# Patient Record
Sex: Female | Born: 2000 | Race: White | Hispanic: No | Marital: Single | Smoking: Never smoker
Health system: Southern US, Community
[De-identification: ages and names within clinical notes are randomized; demographics above are authoritative.]

## PROBLEM LIST (undated history)

## (undated) DIAGNOSIS — F32A Depression, unspecified: Secondary | ICD-10-CM

---

## 2019-08-27 DIAGNOSIS — F1991 Other psychoactive substance use, unspecified, in remission: Secondary | ICD-10-CM | POA: Insufficient documentation

## 2019-08-27 DIAGNOSIS — Z87898 Personal history of other specified conditions: Secondary | ICD-10-CM | POA: Insufficient documentation

## 2020-07-07 DIAGNOSIS — F329 Major depressive disorder, single episode, unspecified: Secondary | ICD-10-CM | POA: Insufficient documentation

## 2020-09-19 ENCOUNTER — Telehealth (HOSPITAL_COMMUNITY): Payer: Medicaid Other | Admitting: Psychiatry

## 2020-10-10 ENCOUNTER — Telehealth (INDEPENDENT_AMBULATORY_CARE_PROVIDER_SITE_OTHER): Payer: Medicaid Other | Admitting: Psychiatry

## 2020-10-10 ENCOUNTER — Encounter (HOSPITAL_COMMUNITY): Payer: Self-pay | Admitting: Psychiatry

## 2020-10-10 DIAGNOSIS — F3341 Major depressive disorder, recurrent, in partial remission: Secondary | ICD-10-CM | POA: Diagnosis not present

## 2020-10-10 DIAGNOSIS — F411 Generalized anxiety disorder: Secondary | ICD-10-CM

## 2020-10-10 DIAGNOSIS — Z87898 Personal history of other specified conditions: Secondary | ICD-10-CM

## 2020-10-10 DIAGNOSIS — F1991 Other psychoactive substance use, unspecified, in remission: Secondary | ICD-10-CM

## 2020-10-10 MED ORDER — ESCITALOPRAM OXALATE 20 MG PO TABS
30.0000 mg | ORAL_TABLET | Freq: Every day | ORAL | 0 refills | Status: DC
Start: 2020-10-10 — End: 2020-11-15

## 2020-10-10 NOTE — Progress Notes (Signed)
Psychiatric Initial Adult Assessment   Patient Identification: Tammy Singh MRN:  [id number] Date of Evaluation:  10/10/2020 Referral Source: primary care Chief Complaint:  establish care, depression Visit Diagnosis:    ICD-10-CM   1. MDD (major depressive disorder), recurrent, in partial remission (HCC)  F33.41   2. GAD (generalized anxiety disorder)  F41.1   3. History of drug use  Z87.898     I connected with [name] on 10/10/20 at 11:00 AM EST by a video enabled telemedicine application and verified that I am speaking with the correct person using two identifiers.  Location: Patient: home Provider: home office   I discussed the limitations of evaluation and management by telemedicine and the availability of in person appointments. The patient expressed understanding and agreed to proceed.  History of Present Illness: Patient is a 19 years old currently single Caucasian female referred by primary care physician for management of depression and establish care.  Patient is currently 6 months postpartum  Patient both with her baby with good bonding.  Has history of depression starting 1-1/2-year ago while she was pregnant and before that when she was having relationship concerns.  She endorses episodes of depression that can last for days including decreased interest hopelessness withdrawn crying spells. She has been having postpartum blues recently started on Lexapro now current dose of 20 mg that has helped she feels her depression is improving she is somewhat more motivated still endorses feeling down and subdued and excessive worries related to future related to relationship  She does not endorse psychotic symptoms paranoia or hallucinations  She does endorse using alcohol states she binge drinks mostly over the weekend 67 packs of beer She has used Xanax in the past around 3 years ago she stopped she was using 3 to 4 tablets a day says that she did not wanted to get done  with it so she stopped cold [place] and felt better  Aggravating factors; relationship issues in the past.  Sometimes arguments with mom Modifying factors; her baby, her mom her family  Duration for the last 2 years  There is no clear manic history she does get impulsive at times or by staff or does tattoos that can last for 2 hours or 1 day no associated psychotic symptoms  No prior psych admission with suicide attempt     Past Psychiatric History: depression  Previous Psychotropic Medications: No   Substance Abuse History in the last 12 months:  Yes.    Consequences of Substance Abuse: alcohol use, discussed its effect on mood, depression  Past Medical History: History reviewed. No pertinent past medical history. History reviewed. No pertinent surgical history.  Family Psychiatric History: mom: depression /bipolar, Grand MA depression Alcohol use In family  Family History: History reviewed. No pertinent family history.  Social History:   Social History   Socioeconomic History  . Marital status: Single    Spouse name: Not on file  . Number of children: Not on file  . Years of education: Not on file  . Highest education level: Not on file  Occupational History  . Not on file  Tobacco Use  . Smoking status: Not on file  Substance and Sexual Activity  . Alcohol use: Not on file  . Drug use: Not on file  . Sexual activity: Not on file  Other Topics Concern  . Not on file  Social History Narrative  . Not on file   Social Determinants of Health   Financial Resource Strain:   .  Difficulty of Paying Living Expenses: Not on file  Food Insecurity:   . Worried About Programme researcher, broadcasting/film/video in the Last Year: Not on file  . Ran Out of Food in the Last Year: Not on file  Transportation Needs:   . Lack of Transportation (Medical): Not on file  . Lack of Transportation (Non-Medical): Not on file  Physical Activity:   . Days of Exercise per Week: Not on file  . Minutes of  Exercise per Session: Not on file  Stress:   . Feeling of Stress : Not on file  Social Connections:   . Frequency of Communication with Friends and Family: Not on file  . Frequency of Social Gatherings with Friends and Family: Not on file  . Attends Religious Services: Not on file  . Active Member of Clubs or Organizations: Not on file  . Attends Banker Meetings: Not on file  . Marital Status: Not on file    Additional Social History: grew up with mom and Grand parents. Dad died of car accident when patient was 19 years of age No abuse   Allergies:  No Known Allergies  Metabolic Disorder Labs: No results found for: HGBA1C, MPG No results found for: PROLACTIN No results found for: CHOL, TRIG, HDL, CHOLHDL, VLDL, LDLCALC No results found for: TSH  Therapeutic Level Labs: No results found for: LITHIUM No results found for: CBMZ No results found for: VALPROATE  Current Medications: Current Outpatient Medications  Medication Sig Dispense Refill  . escitalopram (LEXAPRO) 20 MG tablet Take 1.5 tablets (30 mg total) by mouth daily. 45 tablet 0   No current facility-administered medications for this visit.     Psychiatric Specialty Exam: Review of Systems  Cardiovascular: Negative for chest pain.  Psychiatric/Behavioral: Negative for self-injury.    There were no vitals taken for this visit.There is no height or weight on file to calculate BMI.  General Appearance: Casual  Eye Contact:  Fair  Speech:  Normal Rate  Volume:  Decreased  Mood:  somewhat subdued  Affect:  Congruent  Thought Process:  Goal Directed  Orientation:  Full (Time, Place, and Person)  Thought Content:  Rumination  Suicidal Thoughts:  No  Homicidal Thoughts:  No  Memory:  Immediate;   Fair Recent;   Fair  Judgement:  Fair  Insight:  Shallow  Psychomotor Activity:  Decreased  Concentration:  Concentration: Fair and Attention Span: Fair  Recall:  [employer] of Knowledge:Fair   Language: Fair  Akathisia:  No  Handed:   AIMS (if indicated):  not done  Assets:  Communication Skills Desire for Improvement  ADL's:  Intact  Cognition: WNL  Sleep:  Fair   Screenings:   Assessment and Plan: as follows Major depressive disorder recurrent moderate in partial remission; she does feel Lexapro is helping still feels her mind is racing at times when she gets agitated or subdued mood we will increase it to 30 mg discussed side effects Sometimes gets edgy depending upon relationship and also with mom.  Also discussed possibility of adding a mood stabilizer for now she feels it is partly related to depression we will increase it to 30 mg Lexapro as above  Generalized anxiety disorder; still somewhat stressed considering her situation increase Lexapro to 30 mg  She is currently doing therapy with Derwood Kaplan recommend to continue with that  She does have good bonding with the baby and is not breast-feeding  Alcohol use; she binge drinks over the  weekends we discussed abstinence and also alcohol effect on mood depression and medications  She says that she will quit she understands the risk considering family history of alcohol use and past history of Xanax use she is more vulnerable to dependence  Reviewed medications I discussed the assessment and treatment plan with the patient. The patient was provided an opportunity to ask questions and all were answered. The patient agreed with the plan and demonstrated an understanding of the instructions.   The patient was advised to call back or seek an in-person evaluation if the symptoms worsen or if the condition fails to improve as anticipated. Fu 4 weeks or earlier if needed  I provided 35 - 40  minutes of non-face-to-face time during this encounter.   [name], MD 12/6/202111:41 AM

## 2020-11-15 ENCOUNTER — Telehealth (INDEPENDENT_AMBULATORY_CARE_PROVIDER_SITE_OTHER): Payer: Medicaid Other | Admitting: Psychiatry

## 2020-11-15 ENCOUNTER — Encounter (HOSPITAL_COMMUNITY): Payer: Self-pay | Admitting: Psychiatry

## 2020-11-15 DIAGNOSIS — F1991 Other psychoactive substance use, unspecified, in remission: Secondary | ICD-10-CM

## 2020-11-15 DIAGNOSIS — F3341 Major depressive disorder, recurrent, in partial remission: Secondary | ICD-10-CM

## 2020-11-15 DIAGNOSIS — Z87898 Personal history of other specified conditions: Secondary | ICD-10-CM | POA: Diagnosis not present

## 2020-11-15 DIAGNOSIS — F411 Generalized anxiety disorder: Secondary | ICD-10-CM

## 2020-11-15 MED ORDER — ESCITALOPRAM OXALATE 20 MG PO TABS
30.0000 mg | ORAL_TABLET | Freq: Every day | ORAL | 1 refills | Status: DC
Start: 2020-11-15 — End: 2020-12-20

## 2020-11-15 NOTE — Progress Notes (Signed)
[facility] Follow up visit   Patient Identification: Tammy Singh MRN:  [id number] Date of Evaluation:  11/15/2020 Referral Source: primary care Chief Complaint:   follow up  depression Visit Diagnosis:    ICD-10-CM   1. MDD (major depressive disorder), recurrent, in partial remission (HCC)  F33.41   2. GAD (generalized anxiety disorder)  F41.1   3. History of drug use  Z87.898      I connected with [name] on 11/15/20 at  3:00 PM EST by telephone and verified that I am speaking with the correct person using two identifiers.  Location: Patient: home Provider: home office   I discussed the limitations of evaluation and management by telemedicine and the availability of in person appointments. The patient expressed understanding and agreed to proceed.  History of Present Illness: Patient is a 20 years old currently single Caucasian female initially  referred by primary care physician for management of depression and establish care.  Patient is currently 6 months postpartum  Medicine helping some, subdued mood at times and gets anxious, need a car for transport, not working so these things add to anxiety lexapro was increased last visit Mom is supportive Bonding with baby is described good.  She does not endorse psychotic symptoms paranoia or hallucinations    Aggravating factors; relationship issues in the past.  Sometimes arguments with mom Modifying factors; family and her baby.  Duration for the last 2 years  No prior psych admission with suicide attempt     Past Psychiatric History: depression  Previous Psychotropic Medications: No   Substance Abuse History in the last 12 months:  Yes.    Consequences of Substance Abuse: alcohol use, discussed its effect on mood, depression  Past Medical History: History reviewed. No pertinent past medical history. History reviewed. No pertinent surgical history.  Family Psychiatric History: mom: depression /bipolar, Grand  MA depression Alcohol use In family  Family History: History reviewed. No pertinent family history.  Social History:   Social History   Socioeconomic History  . Marital status: Single    Spouse name: Not on file  . Number of children: Not on file  . Years of education: Not on file  . Highest education level: Not on file  Occupational History  . Not on file  Tobacco Use  . Smoking status: Not on file  . Smokeless tobacco: Not on file  Substance and Sexual Activity  . Alcohol use: Not on file  . Drug use: Not on file  . Sexual activity: Not on file  Other Topics Concern  . Not on file  Social History Narrative  . Not on file   Social Determinants of Health   Financial Resource Strain: Not on file  Food Insecurity: Not on file  Transportation Needs: Not on file  Physical Activity: Not on file  Stress: Not on file  Social Connections: Not on file      Allergies:  No Known Allergies  Metabolic Disorder Labs: No results found for: HGBA1C, MPG No results found for: PROLACTIN No results found for: CHOL, TRIG, HDL, CHOLHDL, VLDL, LDLCALC No results found for: TSH  Therapeutic Level Labs: No results found for: LITHIUM No results found for: CBMZ No results found for: VALPROATE  Current Medications: Current Outpatient Medications  Medication Sig Dispense Refill  . escitalopram (LEXAPRO) 20 MG tablet Take 1.5 tablets (30 mg total) by mouth daily. 45 tablet 1   No current facility-administered medications for this visit.     Psychiatric Specialty Exam:  Review of Systems  Cardiovascular: Negative for chest pain.  Psychiatric/Behavioral: Negative for agitation and self-injury.    There were no vitals taken for this visit.There is no height or weight on file to calculate BMI.  General Appearance:  Eye Contact:   Speech:  Normal Rate  Volume:  Decreased  Mood:  somewhat subdued  Affect:  Congruent  Thought Process:  Goal Directed  Orientation:  Full (Time,  Place, and Person)  Thought Content:  Rumination  Suicidal Thoughts:  No  Homicidal Thoughts:  No  Memory:  Immediate;   Fair Recent;   Fair  Judgement:  Fair  Insight:  Shallow  Psychomotor Activity:  Decreased  Concentration:  Concentration: Fair and Attention Span: Fair  Recall:  [employer] of Knowledge:Fair  Language: Fair  Akathisia:  No  Handed:   AIMS (if indicated):  not done  Assets:  Communication Skills Desire for Improvement  ADL's:  Intact  Cognition: WNL  Sleep:  Fair   Screenings:   Assessment and Plan: as follows Major depressive disorder recurrent moderate in partial remission; Somewhat subdued due to circumstances but tolerating med and helps some  Does not want to increase more for now   Generalized anxiety disorder; worries are reasonable, continue lexapro,  She is currently doing therapy with Derwood Kaplan recommend to continue with that   Alcohol use; she binge drinks over the weekends we discussed abstinence and also alcohol effect on mood depression and medications   Reviewed medications I discussed the assessment and treatment plan with the patient. The patient was provided an opportunity to ask questions and all were answered. The patient agreed with the plan and demonstrated an understanding of the instructions.   The patient was advised to call back or seek an in-person evaluation if the symptoms worsen or if the condition fails to improve as anticipated. Fu 4 weeks or earlier if needed  I provided 15  minutes of non-face-to-face time during this encounter.   [name], MD 1/11/20223:14 PM

## 2020-12-20 ENCOUNTER — Encounter (HOSPITAL_COMMUNITY): Payer: Self-pay | Admitting: Psychiatry

## 2020-12-20 ENCOUNTER — Telehealth (INDEPENDENT_AMBULATORY_CARE_PROVIDER_SITE_OTHER): Payer: Medicaid Other | Admitting: Psychiatry

## 2020-12-20 DIAGNOSIS — F1991 Other psychoactive substance use, unspecified, in remission: Secondary | ICD-10-CM

## 2020-12-20 DIAGNOSIS — F3341 Major depressive disorder, recurrent, in partial remission: Secondary | ICD-10-CM

## 2020-12-20 DIAGNOSIS — Z87898 Personal history of other specified conditions: Secondary | ICD-10-CM | POA: Diagnosis not present

## 2020-12-20 DIAGNOSIS — F411 Generalized anxiety disorder: Secondary | ICD-10-CM

## 2020-12-20 MED ORDER — ESCITALOPRAM OXALATE 20 MG PO TABS
30.0000 mg | ORAL_TABLET | Freq: Every day | ORAL | 1 refills | Status: DC
Start: 2020-12-20 — End: 2022-02-19

## 2020-12-20 NOTE — Progress Notes (Signed)
[facility] Follow up visit   Patient Identification: Tammy Singh MRN:  [id number] Date of Evaluation:  12/20/2020 Referral Source: primary care Chief Complaint:   follow up  depression Visit Diagnosis:    ICD-10-CM   1. MDD (major depressive disorder), recurrent, in partial remission (HCC)  F33.41   2. GAD (generalized anxiety disorder)  F41.1   3. History of drug use  Z87.898      Virtual Visit via Video Note  I connected with Tammy Singh on 12/20/20 at  4:15 PM EST by a video enabled telemedicine application and verified that I am speaking with the correct person using two identifiers.  Location: Patient: home Provider: office   I discussed the limitations of evaluation and management by telemedicine and the availability of in person appointments. The patient expressed understanding and agreed to proceed.     I discussed the assessment and treatment plan with the patient. The patient was provided an opportunity to ask questions and all were answered. The patient agreed with the plan and demonstrated an understanding of the instructions.   The patient was advised to call back or seek an in-person evaluation if the symptoms worsen or if the condition fails to improve as anticipated.  I provided 10 minutes of non-face-to-face time during this encounter.   [name], MD  History of Present Illness: Patient is a 20 years old currently single Caucasian female initially  referred by primary care physician for management of depression and establish care.  Patient is postpartum 8 months now  Doing better, feels depression improved Mom is supportive and has good bonding    She does not endorse psychotic symptoms paranoia or hallucinations  Aggravating factors; relationship issues in the past. Arguments with mom at times Modifying factors; family, baby  Duration for the last 2 years  No prior psych admission with suicide attempt     Past Psychiatric History:  depression  Previous Psychotropic Medications: No   Substance Abuse History in the last 12 months:  Yes.    Consequences of Substance Abuse: alcohol use, discussed its effect on mood, depression  Past Medical History: History reviewed. No pertinent past medical history. History reviewed. No pertinent surgical history.  Family Psychiatric History: mom: depression /bipolar, Grand MA depression Alcohol use In family  Family History: History reviewed. No pertinent family history.  Social History:   Social History   Socioeconomic History  . Marital status: Single    Spouse name: Not on file  . Number of children: Not on file  . Years of education: Not on file  . Highest education level: Not on file  Occupational History  . Not on file  Tobacco Use  . Smoking status: Not on file  . Smokeless tobacco: Not on file  Substance and Sexual Activity  . Alcohol use: Not on file  . Drug use: Not on file  . Sexual activity: Not on file  Other Topics Concern  . Not on file  Social History Narrative  . Not on file   Social Determinants of Health   Financial Resource Strain: Not on file  Food Insecurity: Not on file  Transportation Needs: Not on file  Physical Activity: Not on file  Stress: Not on file  Social Connections: Not on file      Allergies:  No Known Allergies  Metabolic Disorder Labs: No results found for: HGBA1C, MPG No results found for: PROLACTIN No results found for: CHOL, TRIG, HDL, CHOLHDL, VLDL, LDLCALC No results found for: TSH  Therapeutic Level Labs: No results found for: LITHIUM No results found for: CBMZ No results found for: VALPROATE  Current Medications: Current Outpatient Medications  Medication Sig Dispense Refill  . escitalopram (LEXAPRO) 20 MG tablet Take 1.5 tablets (30 mg total) by mouth daily. 45 tablet 1   No current facility-administered medications for this visit.     Psychiatric Specialty Exam: Review of Systems   Cardiovascular: Negative for chest pain.  Psychiatric/Behavioral: Negative for agitation and self-injury.    There were no vitals taken for this visit.There is no height or weight on file to calculate BMI.  General Appearance:casual  Eye Contact: fair  Speech:  Normal Rate  Volume:  Decreased  Mood:  better  Affect:  Congruent  Thought Process:  Goal Directed  Orientation:  Full (Time, Place, and Person)  Thought Content:  Rumination  Suicidal Thoughts:  No  Homicidal Thoughts:  No  Memory:  Immediate;   Fair Recent;   Fair  Judgement:  Fair  Insight:  Shallow  Psychomotor Activity:  Decreased  Concentration:  Concentration: Fair and Attention Span: Fair  Recall:  [employer] of Knowledge:Fair  Language: Fair  Akathisia:  No  Handed:   AIMS (if indicated):  not done  Assets:  Communication Skills Desire for Improvement  ADL's:  Intact  Cognition: WNL  Sleep:  Fair   Screenings: Flowsheet Row Video [facility] [place]  C-SSRS RISK CATEGORY No Risk      Assessment and Plan: as follows  Prior documentation reviewed  Major depressive disorder recurrent moderate in partial remission; Better, continue lexapro Does not want to increase more for now   Generalized anxiety disorder; improving, continue lexapro    Alcohol use; understands not to drink, was binge drinking in the past. Knows effect   Reviewed medications Fu 5m.   [name], MD 2/15/20224:27 PM

## 2021-02-16 ENCOUNTER — Telehealth (HOSPITAL_COMMUNITY): Payer: Medicaid Other | Admitting: Psychiatry

## 2022-02-06 ENCOUNTER — Encounter (HOSPITAL_COMMUNITY): Payer: Self-pay | Admitting: Emergency Medicine

## 2022-02-06 ENCOUNTER — Other Ambulatory Visit: Payer: Self-pay

## 2022-02-06 ENCOUNTER — Inpatient Hospital Stay (HOSPITAL_COMMUNITY)
Admission: EM | Admit: 2022-02-06 | Discharge: 2022-02-09 | DRG: 418 | Disposition: A | Discharge status: Home / Self Care | Payer: Medicaid Other | Attending: General Surgery | Admitting: General Surgery

## 2022-02-06 DIAGNOSIS — K810 Acute cholecystitis: Secondary | ICD-10-CM

## 2022-02-06 DIAGNOSIS — K8020 Calculus of gallbladder without cholecystitis without obstruction: Secondary | ICD-10-CM

## 2022-02-06 DIAGNOSIS — K859 Acute pancreatitis without necrosis or infection, unspecified: Secondary | ICD-10-CM

## 2022-02-06 DIAGNOSIS — K8000 Calculus of gallbladder with acute cholecystitis without obstruction: Secondary | ICD-10-CM | POA: Diagnosis present

## 2022-02-06 DIAGNOSIS — Z3201 Encounter for pregnancy test, result positive: Secondary | ICD-10-CM | POA: Diagnosis present

## 2022-02-06 DIAGNOSIS — Z3490 Encounter for supervision of normal pregnancy, unspecified, unspecified trimester: Secondary | ICD-10-CM

## 2022-02-06 DIAGNOSIS — K851 Biliary acute pancreatitis without necrosis or infection: Principal | ICD-10-CM | POA: Diagnosis present

## 2022-02-06 DIAGNOSIS — F17290 Nicotine dependence, other tobacco product, uncomplicated: Secondary | ICD-10-CM | POA: Diagnosis present

## 2022-02-06 DIAGNOSIS — D72829 Elevated white blood cell count, unspecified: Secondary | ICD-10-CM | POA: Diagnosis present

## 2022-02-06 DIAGNOSIS — Z3491 Encounter for supervision of normal pregnancy, unspecified, first trimester: Secondary | ICD-10-CM

## 2022-02-06 HISTORY — DX: Depression, unspecified: F32.A

## 2022-02-06 NOTE — ED Triage Notes (Signed)
Ems gave pt 4mg  of zofran. ?

## 2022-02-06 NOTE — ED Triage Notes (Signed)
Pt c/o abd/back/chest pain with n/v. ?

## 2022-02-07 ENCOUNTER — Inpatient Hospital Stay (HOSPITAL_COMMUNITY): Payer: Medicaid Other

## 2022-02-07 ENCOUNTER — Encounter (HOSPITAL_COMMUNITY): Payer: Self-pay | Admitting: Family Medicine

## 2022-02-07 DIAGNOSIS — D72829 Elevated white blood cell count, unspecified: Secondary | ICD-10-CM | POA: Diagnosis not present

## 2022-02-07 DIAGNOSIS — K8020 Calculus of gallbladder without cholecystitis without obstruction: Secondary | ICD-10-CM | POA: Diagnosis not present

## 2022-02-07 DIAGNOSIS — K859 Acute pancreatitis without necrosis or infection, unspecified: Secondary | ICD-10-CM | POA: Diagnosis present

## 2022-02-07 DIAGNOSIS — K8000 Calculus of gallbladder with acute cholecystitis without obstruction: Secondary | ICD-10-CM | POA: Diagnosis present

## 2022-02-07 DIAGNOSIS — F17290 Nicotine dependence, other tobacco product, uncomplicated: Secondary | ICD-10-CM | POA: Diagnosis present

## 2022-02-07 DIAGNOSIS — Z3201 Encounter for pregnancy test, result positive: Secondary | ICD-10-CM | POA: Diagnosis present

## 2022-02-07 DIAGNOSIS — Z3491 Encounter for supervision of normal pregnancy, unspecified, first trimester: Secondary | ICD-10-CM | POA: Diagnosis not present

## 2022-02-07 DIAGNOSIS — K851 Biliary acute pancreatitis without necrosis or infection: Secondary | ICD-10-CM | POA: Diagnosis present

## 2022-02-07 DIAGNOSIS — K810 Acute cholecystitis: Secondary | ICD-10-CM | POA: Diagnosis not present

## 2022-02-07 LAB — URINALYSIS, ROUTINE W REFLEX MICROSCOPIC
Bilirubin Urine: NEGATIVE
Glucose, UA: NEGATIVE mg/dL
Hgb urine dipstick: NEGATIVE
Ketones, ur: 5 mg/dL — AB
Nitrite: NEGATIVE
Protein, ur: 100 mg/dL — AB
Specific Gravity, Urine: 1.024 (ref 1.005–1.030)
pH: 7 (ref 5.0–8.0)

## 2022-02-07 LAB — CBC WITH DIFFERENTIAL/PLATELET
Abs Immature Granulocytes: 0.03 10*3/uL (ref 0.00–0.07)
Abs Immature Granulocytes: 0.04 10*3/uL (ref 0.00–0.07)
Basophils Absolute: 0 10*3/uL (ref 0.0–0.1)
Basophils Absolute: 0 10*3/uL (ref 0.0–0.1)
Basophils Relative: 0 %
Basophils Relative: 0 %
Eosinophils Absolute: 0 10*3/uL (ref 0.0–0.5)
Eosinophils Absolute: 0 10*3/uL (ref 0.0–0.5)
Eosinophils Relative: 0 %
Eosinophils Relative: 0 %
HCT: 37.2 % (ref 36.0–46.0)
HCT: 40.1 % (ref 36.0–46.0)
Hemoglobin: 12 g/dL (ref 12.0–15.0)
Hemoglobin: 13.1 g/dL (ref 12.0–15.0)
Immature Granulocytes: 0 %
Immature Granulocytes: 0 %
Lymphocytes Relative: 10 %
Lymphocytes Relative: 22 %
Lymphs Abs: 1.6 10*3/uL (ref 0.7–4.0)
Lymphs Abs: 1.9 10*3/uL (ref 0.7–4.0)
MCH: 26.8 pg (ref 26.0–34.0)
MCH: 27.1 pg (ref 26.0–34.0)
MCHC: 32.3 g/dL (ref 30.0–36.0)
MCHC: 32.7 g/dL (ref 30.0–36.0)
MCV: 82.9 fL (ref 80.0–100.0)
MCV: 83 fL (ref 80.0–100.0)
Monocytes Absolute: 0.5 10*3/uL (ref 0.1–1.0)
Monocytes Absolute: 0.8 10*3/uL (ref 0.1–1.0)
Monocytes Relative: 5 %
Monocytes Relative: 6 %
Neutro Abs: 13.4 10*3/uL — ABNORMAL HIGH (ref 1.7–7.7)
Neutro Abs: 6.3 10*3/uL (ref 1.7–7.7)
Neutrophils Relative %: 72 %
Neutrophils Relative %: 85 %
Platelets: 399 10*3/uL (ref 150–400)
Platelets: 458 10*3/uL — ABNORMAL HIGH (ref 150–400)
RBC: 4.48 MIL/uL (ref 3.87–5.11)
RBC: 4.84 MIL/uL (ref 3.87–5.11)
RDW: 13.9 % (ref 11.5–15.5)
RDW: 14.1 % (ref 11.5–15.5)
WBC: 15.8 10*3/uL — ABNORMAL HIGH (ref 4.0–10.5)
WBC: 8.8 10*3/uL (ref 4.0–10.5)
nRBC: 0 % (ref 0.0–0.2)
nRBC: 0 % (ref 0.0–0.2)

## 2022-02-07 LAB — COMPREHENSIVE METABOLIC PANEL
ALT: 140 U/L — ABNORMAL HIGH (ref 0–44)
ALT: 157 U/L — ABNORMAL HIGH (ref 0–44)
ALT: 22 U/L (ref 0–44)
AST: 150 U/L — ABNORMAL HIGH (ref 15–41)
AST: 36 U/L (ref 15–41)
AST: 469 U/L — ABNORMAL HIGH (ref 15–41)
Albumin: 3.4 g/dL — ABNORMAL LOW (ref 3.5–5.0)
Albumin: 3.6 g/dL (ref 3.5–5.0)
Albumin: 4.1 g/dL (ref 3.5–5.0)
Alkaline Phosphatase: 101 U/L (ref 38–126)
Alkaline Phosphatase: 103 U/L (ref 38–126)
Alkaline Phosphatase: 96 U/L (ref 38–126)
Anion gap: 7 (ref 5–15)
Anion gap: 9 (ref 5–15)
Anion gap: 9 (ref 5–15)
BUN: 13 mg/dL (ref 6–20)
BUN: 14 mg/dL (ref 6–20)
BUN: 9 mg/dL (ref 6–20)
CO2: 21 mmol/L — ABNORMAL LOW (ref 22–32)
CO2: 21 mmol/L — ABNORMAL LOW (ref 22–32)
CO2: 25 mmol/L (ref 22–32)
Calcium: 8.8 mg/dL — ABNORMAL LOW (ref 8.9–10.3)
Calcium: 9 mg/dL (ref 8.9–10.3)
Calcium: 9.5 mg/dL (ref 8.9–10.3)
Chloride: 104 mmol/L (ref 98–111)
Chloride: 108 mmol/L (ref 98–111)
Chloride: 111 mmol/L (ref 98–111)
Creatinine, Ser: 0.5 mg/dL (ref 0.44–1.00)
Creatinine, Ser: 0.51 mg/dL (ref 0.44–1.00)
Creatinine, Ser: 0.57 mg/dL (ref 0.44–1.00)
GFR, Estimated: >60 mL/min (ref >60)
GFR, Estimated: >60 mL/min (ref >60)
GFR, Estimated: >60 mL/min (ref >60)
Glucose, Bld: 112 mg/dL — ABNORMAL HIGH (ref 70–99)
Glucose, Bld: 84 mg/dL (ref 70–99)
Glucose, Bld: 94 mg/dL (ref 70–99)
Potassium: 3.4 mmol/L — ABNORMAL LOW (ref 3.5–5.1)
Potassium: 3.4 mmol/L — ABNORMAL LOW (ref 3.5–5.1)
Potassium: 3.9 mmol/L (ref 3.5–5.1)
Sodium: 138 mmol/L (ref 135–145)
Sodium: 138 mmol/L (ref 135–145)
Sodium: 139 mmol/L (ref 135–145)
Total Bilirubin: 0.8 mg/dL (ref 0.3–1.2)
Total Bilirubin: 1 mg/dL (ref 0.3–1.2)
Total Bilirubin: 1.2 mg/dL (ref 0.3–1.2)
Total Protein: 6.4 g/dL — ABNORMAL LOW (ref 6.5–8.1)
Total Protein: 6.5 g/dL (ref 6.5–8.1)
Total Protein: 7.5 g/dL (ref 6.5–8.1)

## 2022-02-07 LAB — LIPASE, BLOOD: Lipase: 607 U/L — ABNORMAL HIGH (ref 11–51)

## 2022-02-07 LAB — LIPID PANEL
Cholesterol: 108 mg/dL (ref 0–200)
HDL: 54 mg/dL (ref >40)
LDL Cholesterol: 50 mg/dL (ref 0–99)
Total CHOL/HDL Ratio: 2 RATIO
Triglycerides: 20 mg/dL (ref <150)
VLDL: 4 mg/dL (ref 0–40)

## 2022-02-07 LAB — MAGNESIUM: Magnesium: 2 mg/dL (ref 1.7–2.4)

## 2022-02-07 LAB — PREGNANCY, URINE: Preg Test, Ur: NEGATIVE

## 2022-02-07 LAB — HIV ANTIBODY (ROUTINE TESTING W REFLEX): HIV Screen 4th Generation wRfx: NONREACTIVE

## 2022-02-07 LAB — HCG, QUANTITATIVE, PREGNANCY: hCG, Beta Chain, Quant, S: <1 m[IU]/mL (ref <5)

## 2022-02-07 IMAGING — MR MR ABDOMEN WO/W CM MRCP
21 of 24 series · 44 of 48 positions shown · IV contrast (Gadavist)
Comparison: Abdominal sonogram which was performed on [DATE].

CLINICAL DATA: Suspected biliary obstruction, history of
pancreatitis.

EXAM:
MRI ABDOMEN WITHOUT AND WITH CONTRAST (INCLUDING MRCP)
TECHNIQUE: Multiplanar multisequence MR imaging of the abdomen was performed
both before and after the administration of intravenous contrast.
Heavily T2-weighted images of the biliary and pancreatic ducts were
obtained, and three-dimensional MRCP images were rendered by post
processing.
CONTRAST:  6mL GADAVIST GADOBUTROL 1 MMOL/ML IV SOLN

[Series 3: cor haste · coronal · 6.0mm · 1.25mm/px · 1 of 26 slices shown]
[im 1/26]
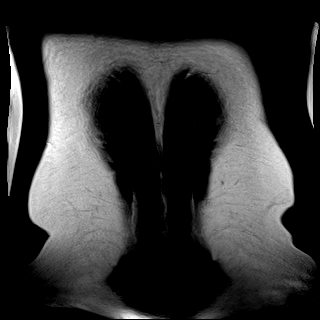

[Series 4: T2 fat-sat · axial · 6.0mm · 1.19mm/px · 1 of 30 slices shown]
[im 1/30]
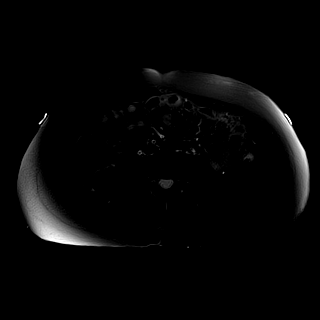

[Series 5: DWI · axial · 6.0mm · 1.42mm/px · 1 of 30 slices shown (1 of 4)]
[im 1/30]
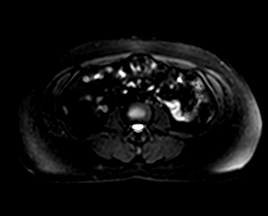

[Series 5: DWI · axial · 6.0mm · 1.42mm/px · 1 of 30 slices shown (2 of 4)]
[im 1/30]
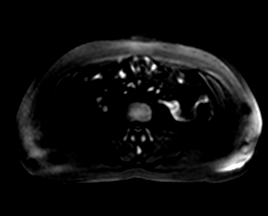

[Series 5: DWI · axial · 6.0mm · 1.42mm/px · 1 of 30 slices shown (3 of 4)]
[im 1/30]
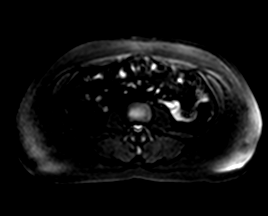

[Series 6: DWI · axial · 6.0mm · 1.42mm/px · 1 of 30 slices shown (4 of 4)]
[im 1/30]
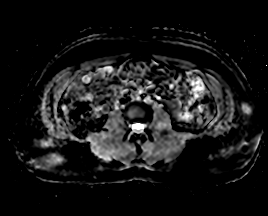

[Series 7: ax in and · axial · 3.0mm · 1.25mm/px · z∈[-116,+97]mm · 2 of 72 slices shown (1 of 2)]
[im 1/72]
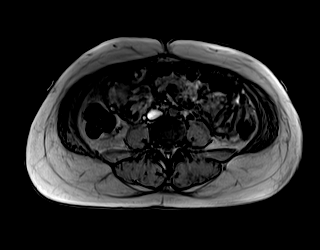
[im 72/72]
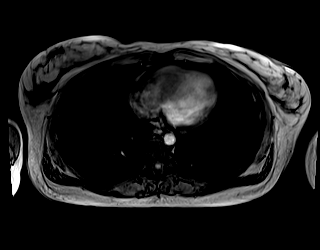

[Series 8: ax in and · axial · 3.0mm · 1.25mm/px · z∈[-116,+97]mm · 3 of 72 slices shown (2 of 2)]
[im 1/72]
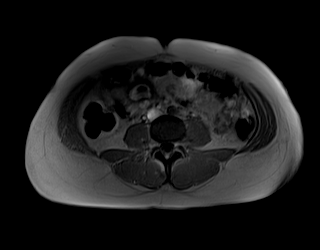
[im 36/72]
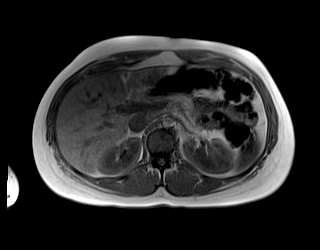
[im 72/72]
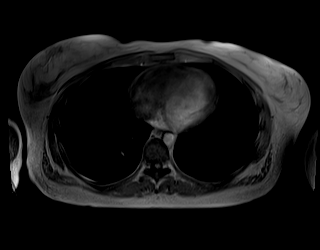

[Series 9: ax haste bh · axial · 6.0mm · 1.19mm/px · 1 of 30 slices shown]
[im 1/30]
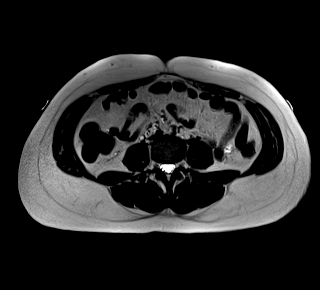

[Series 13: MRCP · coronal · 50.0mm · 0.78mm/px · 1 of 5 slices shown (1 of 2)]
[im 1/5]
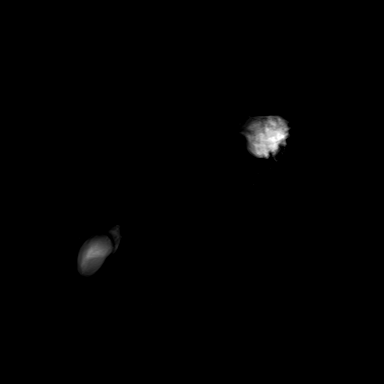

[Series 14: MRCP · coronal · 4.0mm · 1.12mm/px · 1 of 15 slices shown (2 of 2)]
[im 1/15]
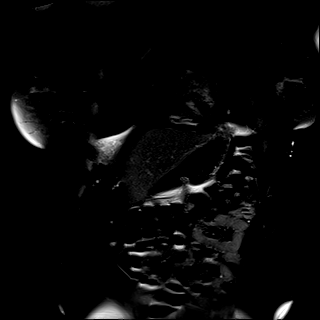

[Series 15: T1 dynamic · axial · 3.0mm · 1.19mm/px · z∈[-116,+97]mm · 3 of 72 slices shown (1 of 7)]
[im 1/72]
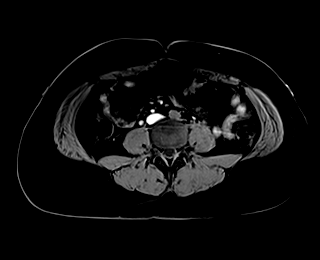
[im 36/72]
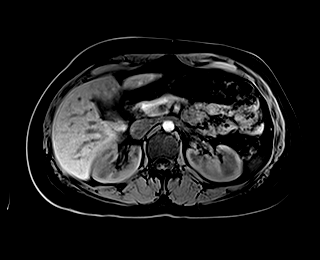
[im 72/72]
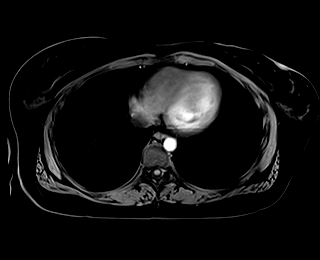

[Series 17: T1 dynamic · axial · 3.0mm · 1.19mm/px · z∈[-116,+97]mm · 3 of 72 slices shown (2 of 7)]
[im 1/72]
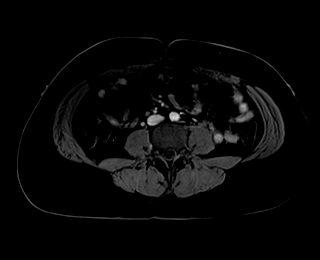
[im 36/72]
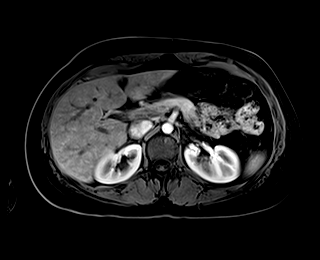
[im 72/72]
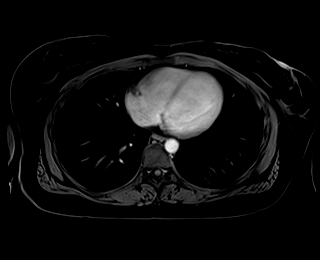

[Series 18: T1 dynamic · axial · 3.0mm · 1.19mm/px · z∈[-116,+97]mm · 3 of 72 slices shown (3 of 7)]
[im 1/72]
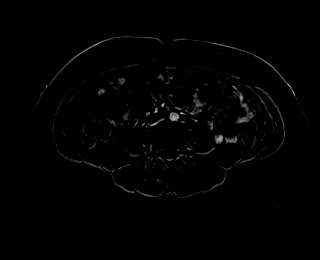
[im 36/72]
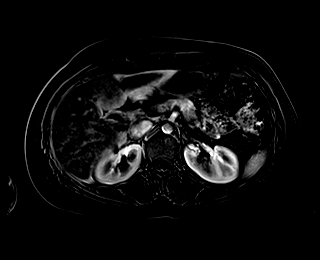
[im 72/72]
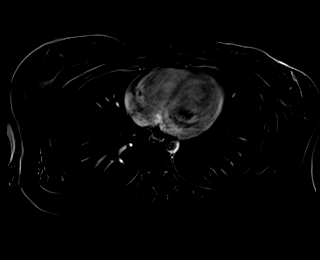

[Series 19: T1 dynamic · axial · 3.0mm · 1.19mm/px · z∈[-116,+97]mm · 3 of 72 slices shown (4 of 7)]
[im 1/72]
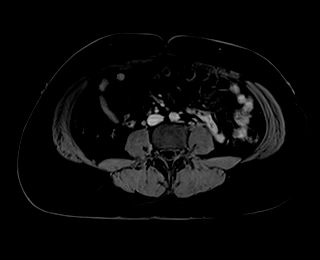
[im 36/72]
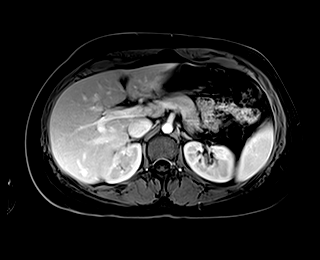
[im 72/72]
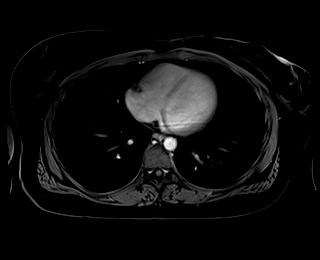

[Series 20: T1 dynamic · axial · 3.0mm · 1.19mm/px · z∈[-116,+97]mm · 3 of 72 slices shown (5 of 7)]
[im 1/72]
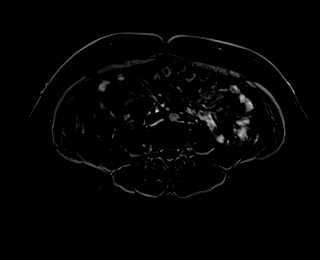
[im 36/72]
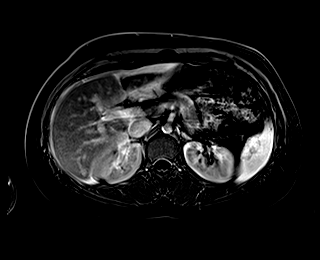
[im 72/72]
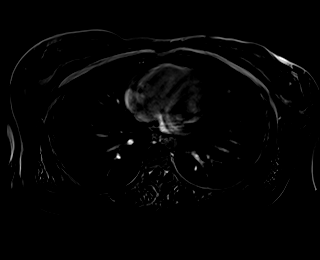

[Series 21: T1 dynamic · axial · 3.0mm · 1.19mm/px · z∈[-116,+97]mm · 3 of 72 slices shown (6 of 7)]
[im 1/72]
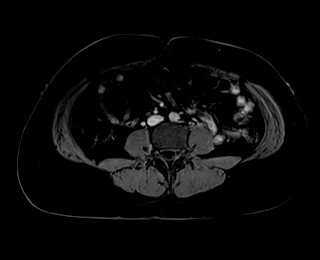
[im 36/72]
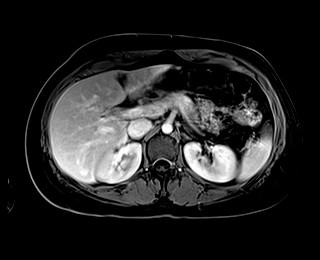
[im 72/72]
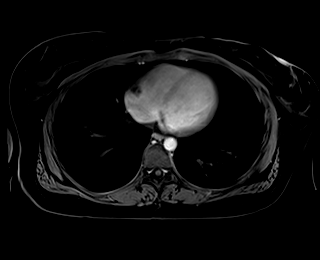

[Series 22: T1 dynamic · axial · 3.0mm · 1.19mm/px · z∈[-116,+97]mm · 3 of 72 slices shown (7 of 7)]
[im 1/72]
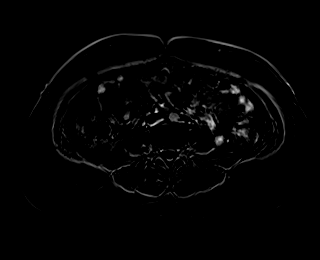
[im 36/72]
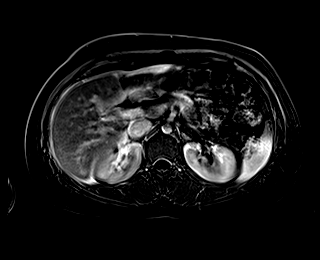
[im 72/72]
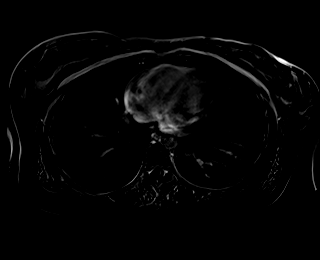

[Series 23: T1 dynamic post-contrast · coronal · 3.0mm · 1.31mm/px · 3 of 72 slices shown (1 of 3)]
[im 1/72]
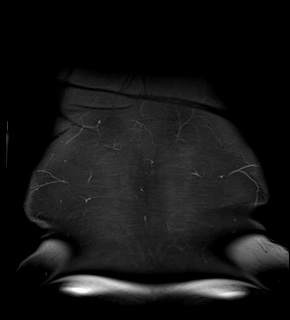
[im 36/72]
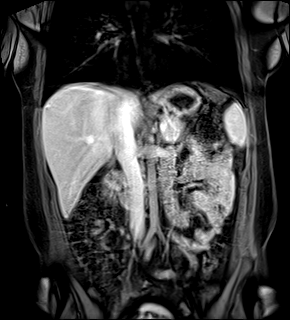
[im 72/72]
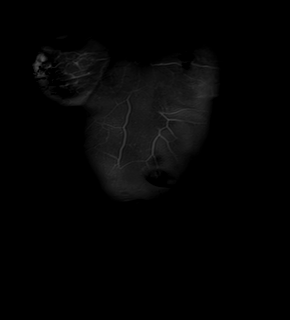

[Series 24: T1 dynamic post-contrast · axial · 3.0mm · 1.19mm/px · z∈[-116,+97]mm · 3 of 72 slices shown (2 of 3)]
[im 1/72]
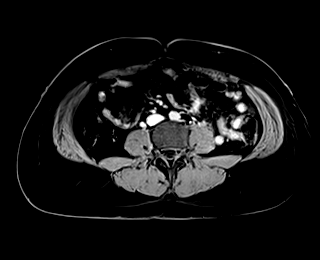
[im 36/72]
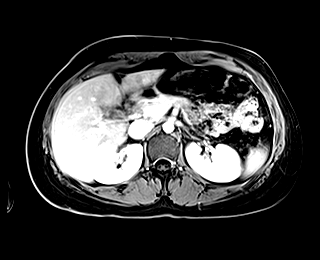
[im 72/72]
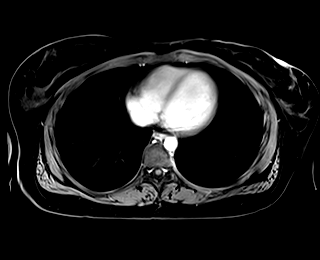

[Series 25: T1 dynamic post-contrast · axial · 3.0mm · 1.19mm/px · z∈[-116,+97]mm · 3 of 72 slices shown (3 of 3)]
[im 1/72]
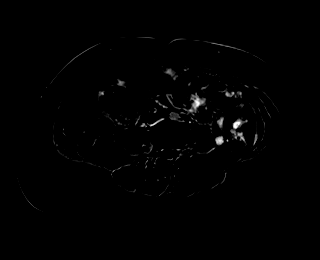
[im 36/72]
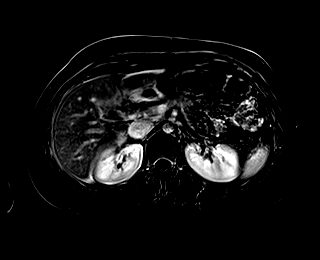
[im 72/72]
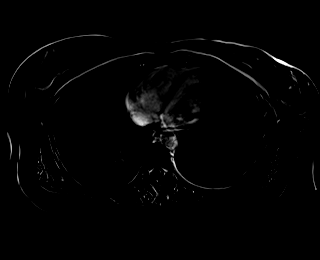

[44 of 48 positions shown; findings below may reference images not displayed]

FINDINGS: Lower chest: No effusion or consolidation at the lung bases.

Hepatobiliary: Normal size and contour without suspicious lesion,
fat deposition or iron deposition. Small hemangiomas are present in
the RIGHT hemiliver, for example on image 21 of series 19 and image
39 of series 19 these are less than a cm. Portal vein is patent.

No biliary duct dilation. No signs of cholelithiasis or
choledocholithiasis. Sludge in the gallbladder.

Gallbladder wall edema.

Pancreas: No signs of substantial pancreatic edema or peripancreatic
fluid. Intrinsic T1 signal is preserved and enhancement is normal.
No signs of pancreatic divisum.

Spleen:  Normal.

Adrenals/Urinary Tract:  Normal adrenal glands.

No signs of perinephric stranding.  No hydronephrosis.

Stomach/Bowel: Normal to the extent evaluated on abdominal MRI.

Vascular/Lymphatic: No pathologically enlarged lymph nodes
identified. No abdominal aortic aneurysm demonstrated.

Other: No ascites. Trace amount of fluid adjacent to the
gallbladder.

Musculoskeletal: No suspicious bone lesions identified.
IMPRESSION: 1. No signs of pancreatic inflammation or pancreatic divisum.
2. Sludge in the gallbladder with gallbladder wall edema with small
amount of pericholecystic fluid. Findings raise the question of
cholecystitis. Consider correlation with HIDA scan.
3. No signs of cholelithiasis or choledocholithiasis. No biliary
duct dilation.

## 2022-02-07 IMAGING — MR MR 3D RECON AT SCANNER
1 series · 15 of 16 positions shown · IV contrast (gadavist)
Comparison: Abdominal sonogram which was performed on [DATE].

CLINICAL DATA: Suspected biliary obstruction, history of
pancreatitis.

EXAM:
MRI ABDOMEN WITHOUT AND WITH CONTRAST (INCLUDING MRCP)
TECHNIQUE: Multiplanar multisequence MR imaging of the abdomen was performed
both before and after the administration of intravenous contrast.
Heavily T2-weighted images of the biliary and pancreatic ducts were
obtained, and three-dimensional MRCP images were rendered by post
processing.
CONTRAST:  6mL GADAVIST GADOBUTROL 1 MMOL/ML IV SOLN

[Series 1032: MRCP · sagittal · 0.6mm · 0.22mm/px · 15 of 19 slices shown]
[im 1/19]
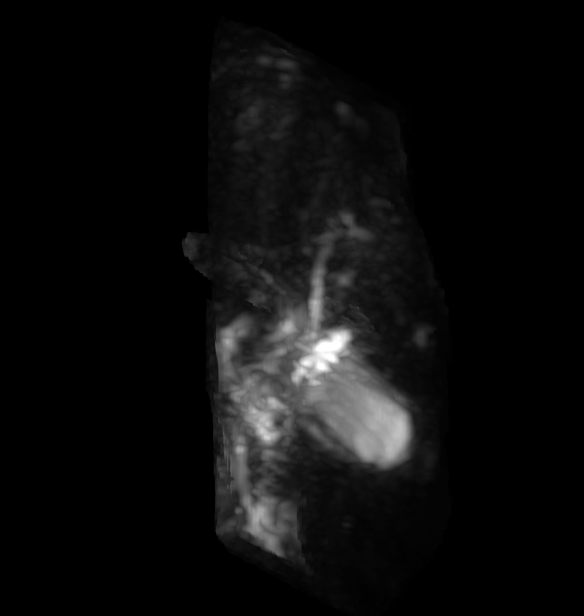
[im 2/19]
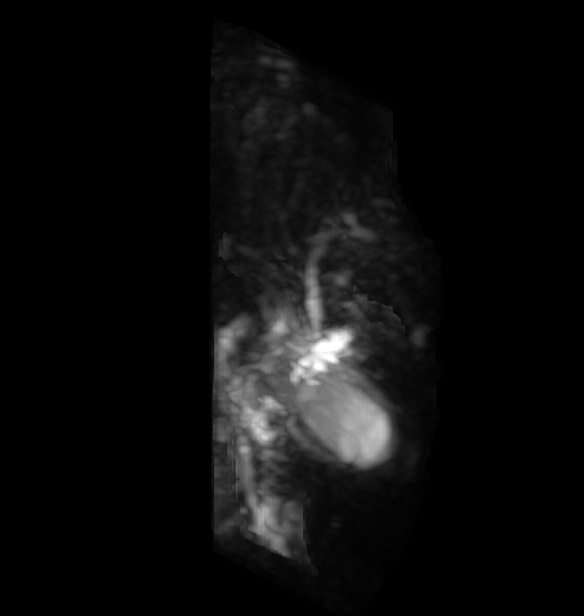
[im 3/19]
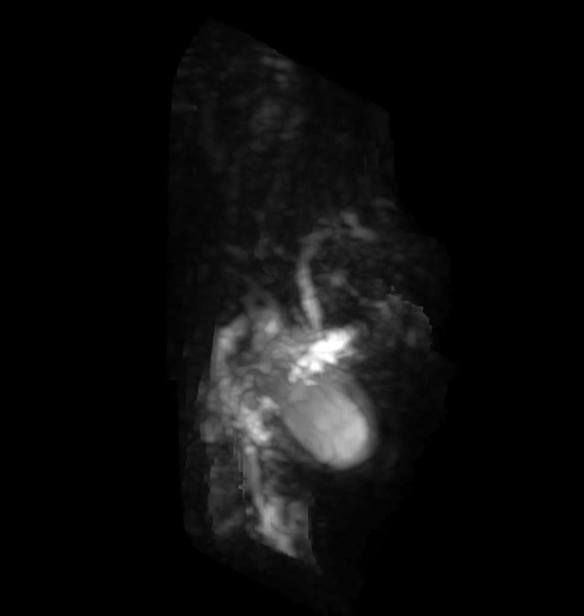
[im 4/19]
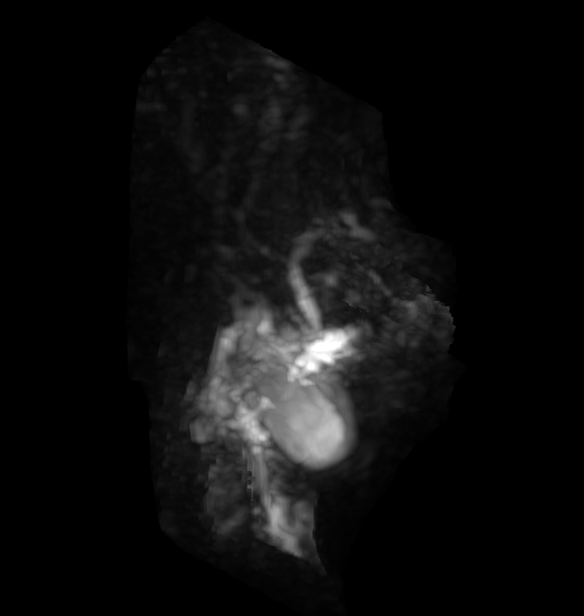
[im 5/19]
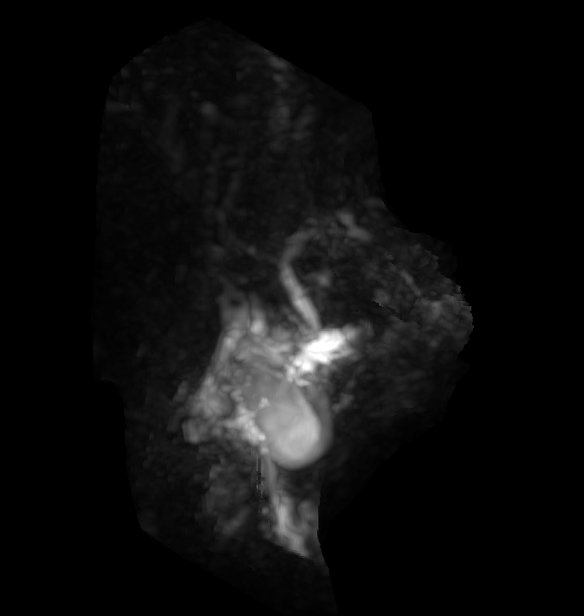
[im 7/19]
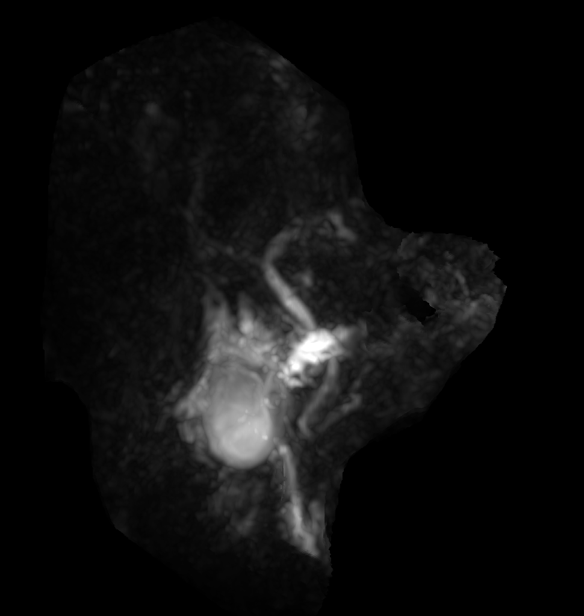
[im 8/19]
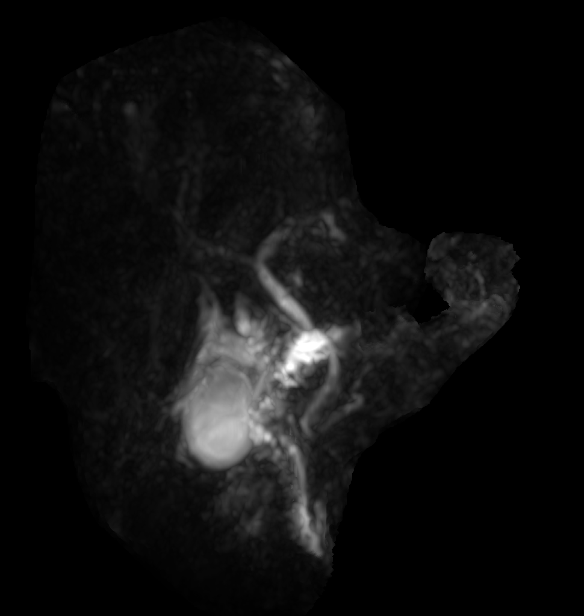
[im 10/19]
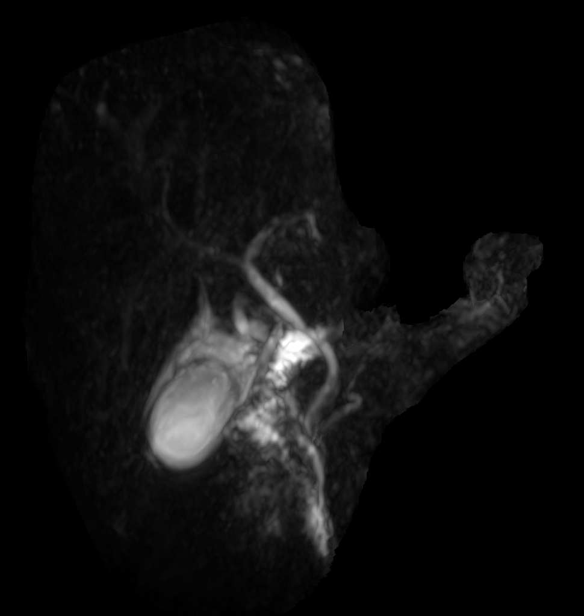
[im 11/19]
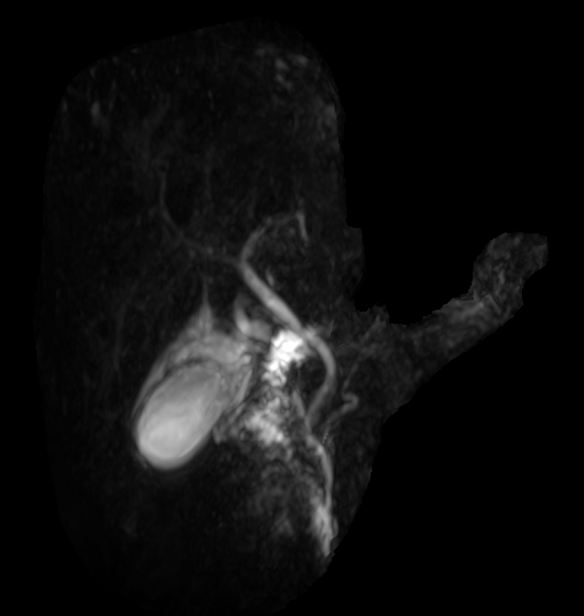
[im 13/19]
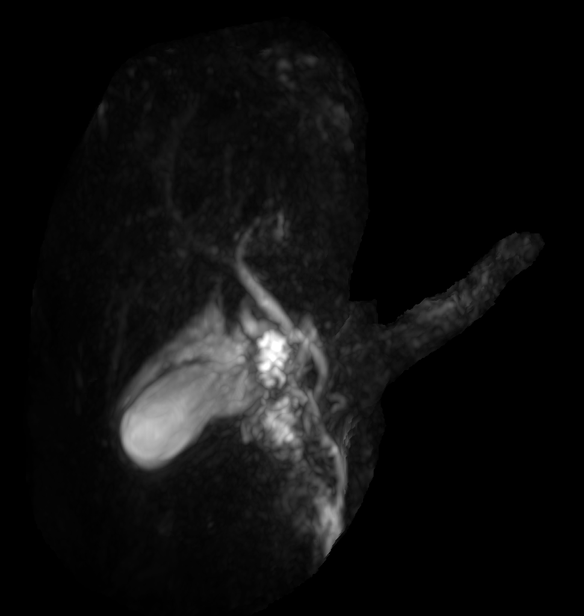
[im 14/19]
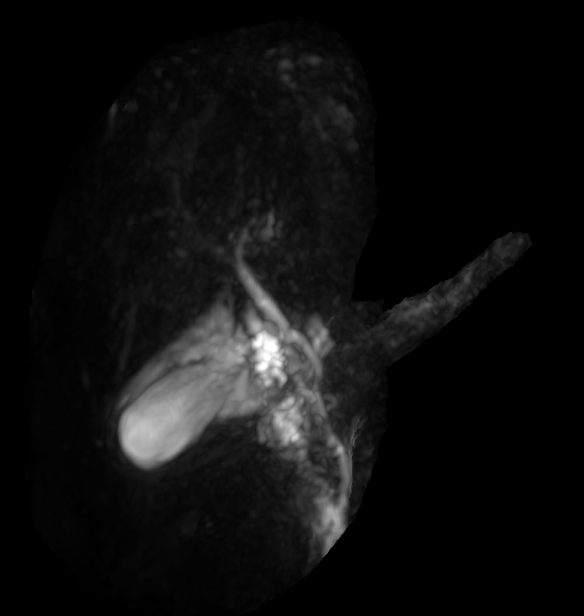
[im 15/19]
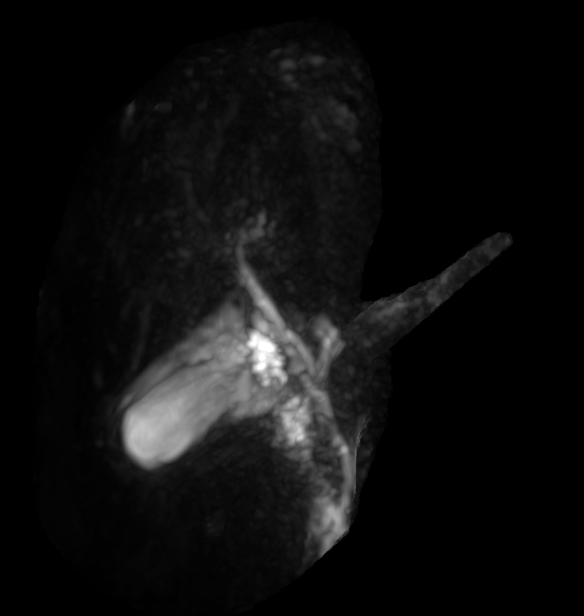
[im 16/19]
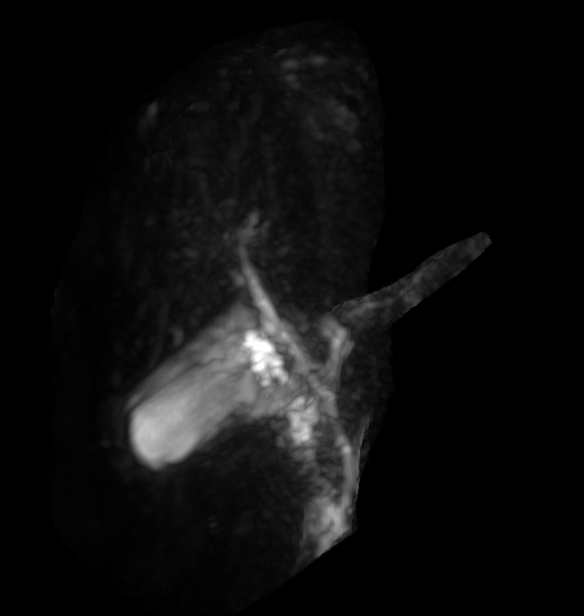
[im 17/19]
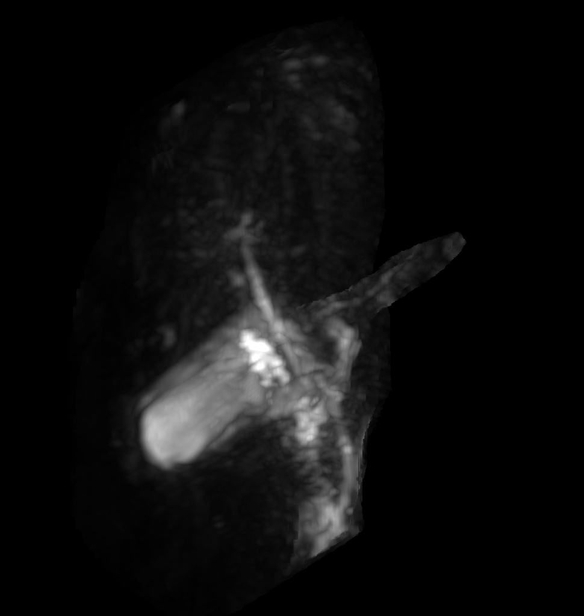
[im 19/19]
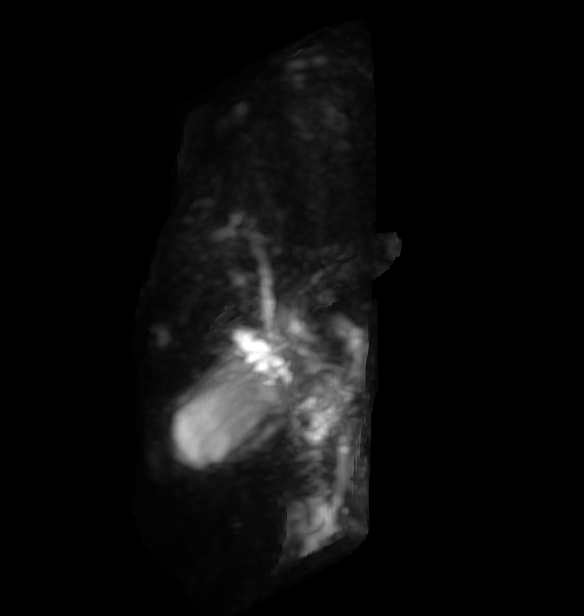

[15 of 16 positions shown; findings below may reference images not displayed]

FINDINGS: Lower chest: No effusion or consolidation at the lung bases.

Hepatobiliary: Normal size and contour without suspicious lesion,
fat deposition or iron deposition. Small hemangiomas are present in
the RIGHT hemiliver, for example on image 21 of series 19 and image
39 of series 19 these are less than a cm. Portal vein is patent.

No biliary duct dilation. No signs of cholelithiasis or
choledocholithiasis. Sludge in the gallbladder.

Gallbladder wall edema.

Pancreas: No signs of substantial pancreatic edema or peripancreatic
fluid. Intrinsic T1 signal is preserved and enhancement is normal.
No signs of pancreatic divisum.

Spleen:  Normal.

Adrenals/Urinary Tract:  Normal adrenal glands.

No signs of perinephric stranding.  No hydronephrosis.

Stomach/Bowel: Normal to the extent evaluated on abdominal MRI.

Vascular/Lymphatic: No pathologically enlarged lymph nodes
identified. No abdominal aortic aneurysm demonstrated.

Other: No ascites. Trace amount of fluid adjacent to the
gallbladder.

Musculoskeletal: No suspicious bone lesions identified.
IMPRESSION: 1. No signs of pancreatic inflammation or pancreatic divisum.
2. Sludge in the gallbladder with gallbladder wall edema with small
amount of pericholecystic fluid. Findings raise the question of
cholecystitis. Consider correlation with HIDA scan.
3. No signs of cholelithiasis or choledocholithiasis. No biliary
duct dilation.

## 2022-02-07 IMAGING — US US ABDOMEN LIMITED
1 series · 14 of 25 positions shown · non-contrast
Comparison: None.

CLINICAL DATA: Abdominal pain.

EXAM:
ULTRASOUND ABDOMEN LIMITED RIGHT UPPER QUADRANT

[Series 1001: us abdomen limited ruq (liver/gb) · 14 of 102 slices shown]
[im 1/102]
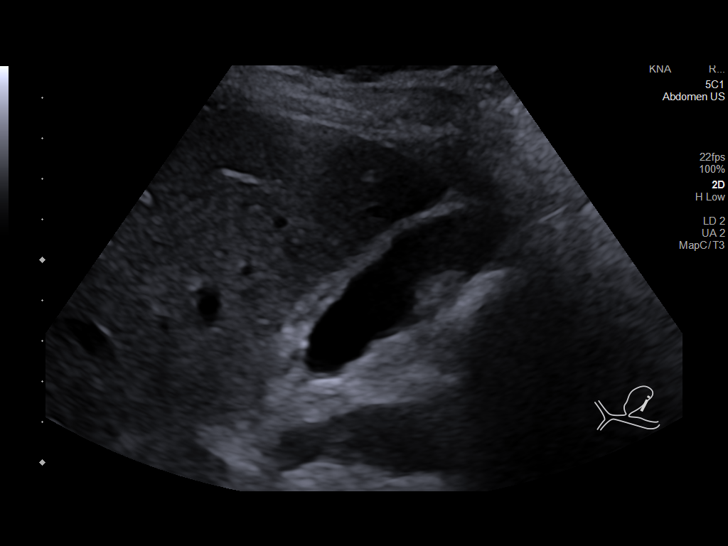
[im 9/102]
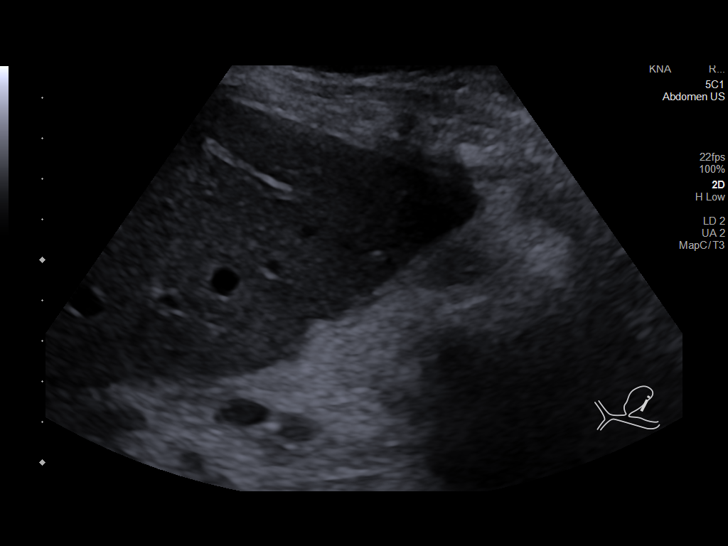
[im 17/102]
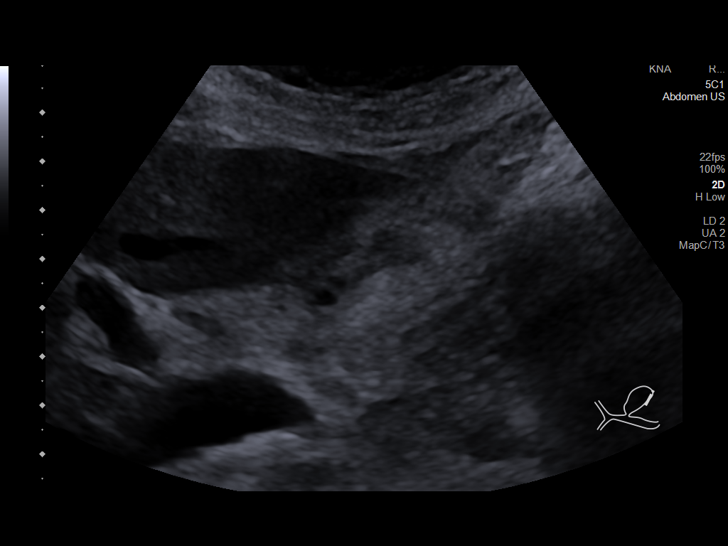
[im 26/102]
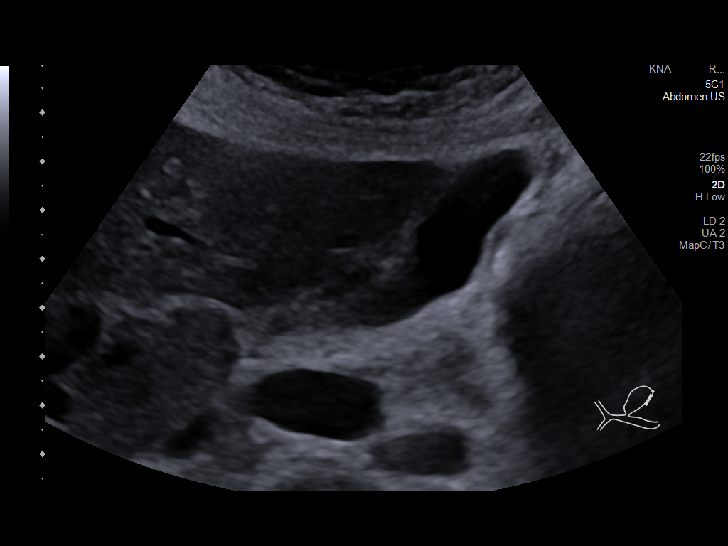
[im 34/102]
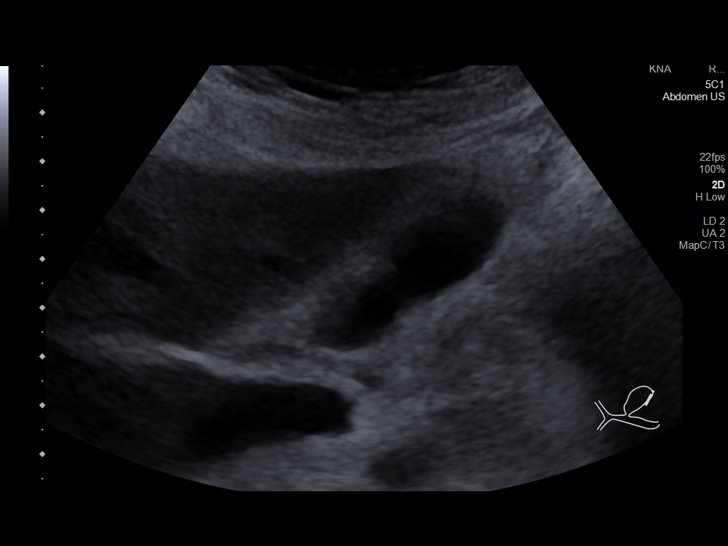
[im 38/102]
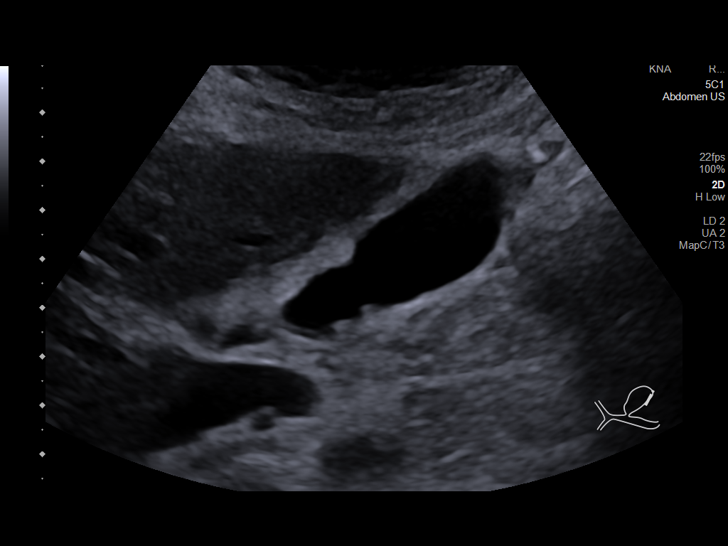
[im 47/102]
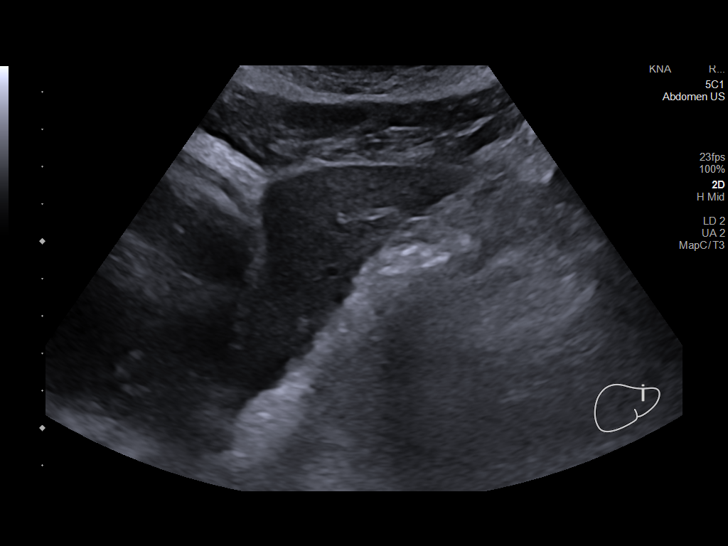
[im 55/102]
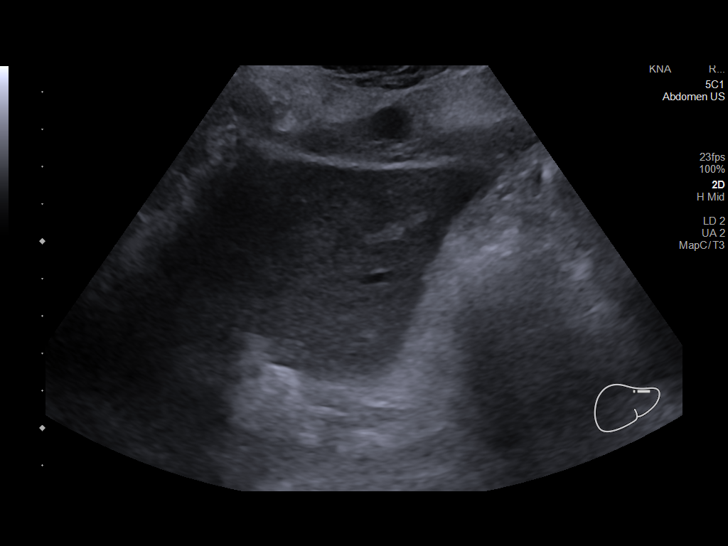
[im 64/102]
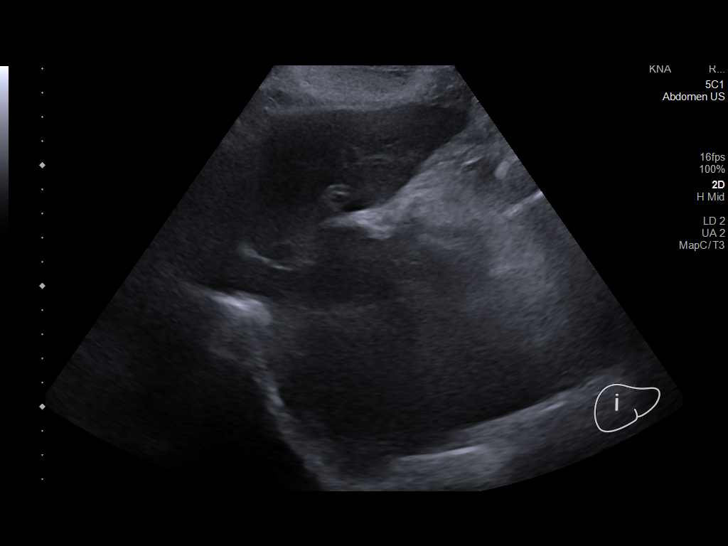
[im 68/102]
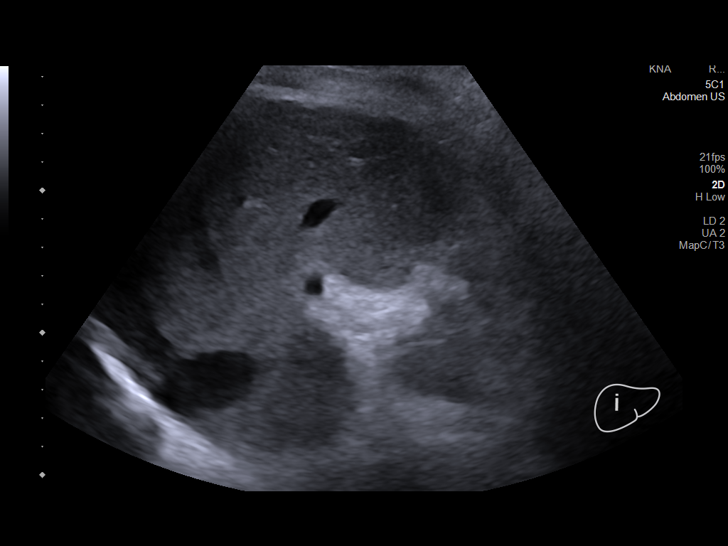
[im 76/102]
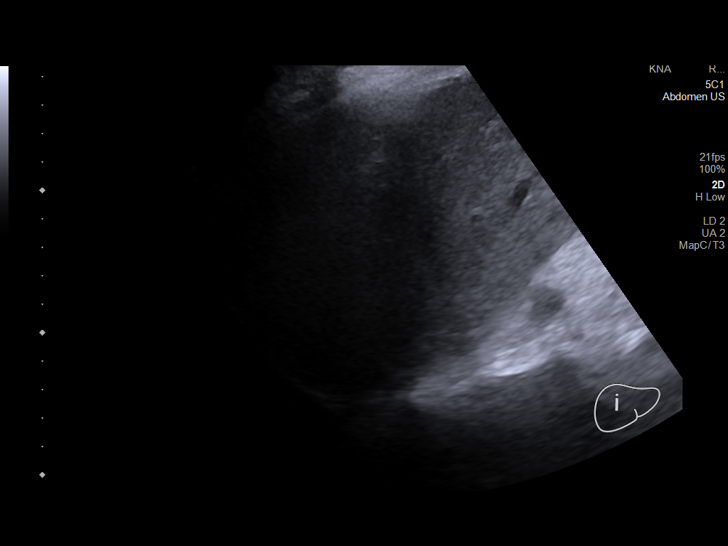
[im 85/102]
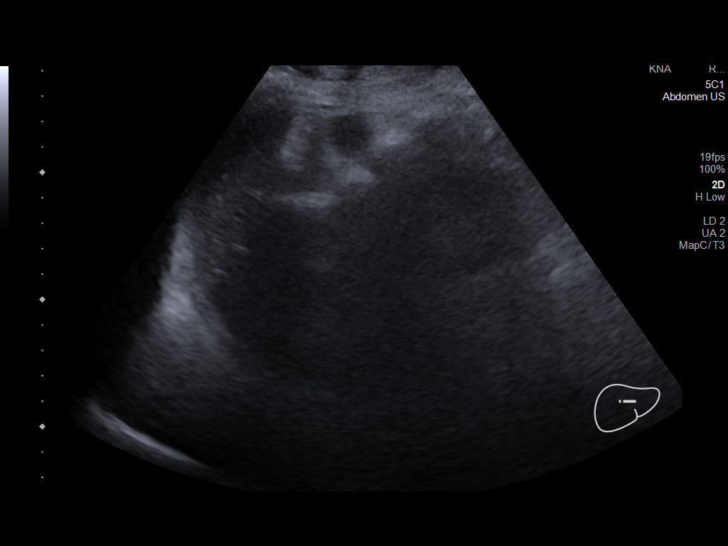
[im 93/102]
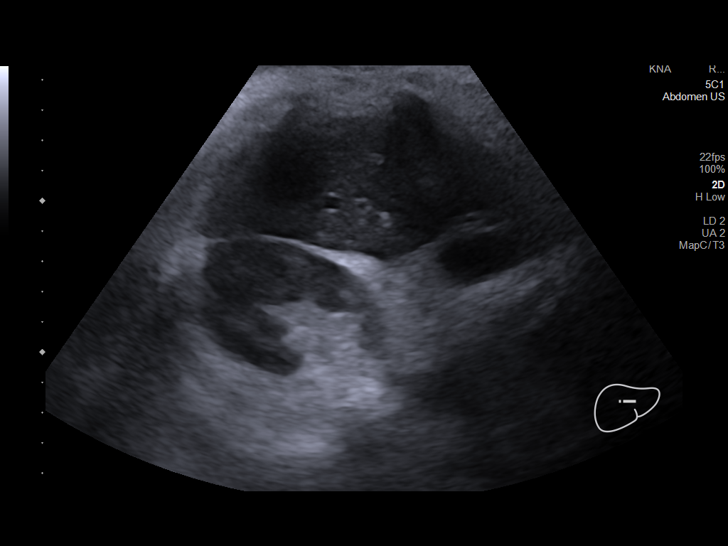
[im 102/102]
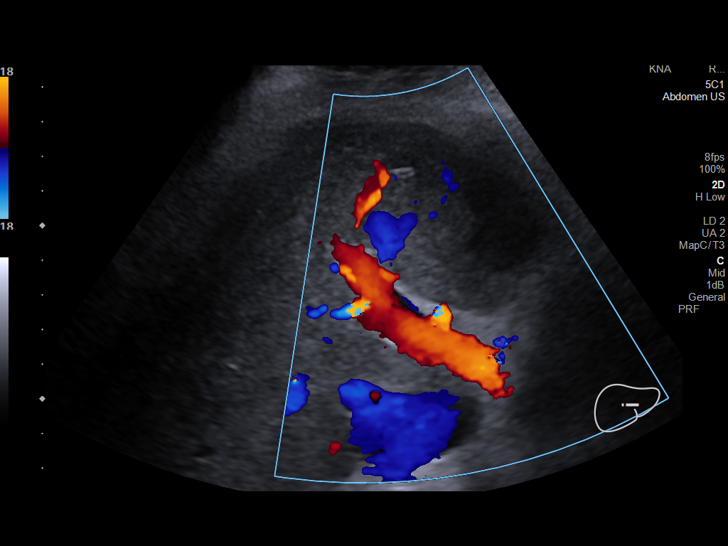

[14 of 25 positions shown; findings below may reference images not displayed]

FINDINGS: Gallbladder:

Sludge with mild wall thickening visualized and trace
pericholecystic fluid. No gallstones. Two polyps measuring up to 3
mm. Positive sonographic Murphy sign noted by sonographer.

Common bile duct:

Diameter: 3 mm, normal.

Liver:

No focal lesion identified. Within normal limits in parenchymal
echogenicity. Portal vein is patent on color Doppler imaging with
normal direction of blood flow towards the liver.

Other: None.
IMPRESSION: 1. Gallbladder sludge with mild wall thickening and trace
pericholecystic fluid. Positive sonographic Murphy sign noted by
sonographer. Findings are suspicious for acute cholecystitis.
Consider further evaluation with HIDA scan.
2. Two 3 mm gallbladder polyps. No further follow-up required.

## 2022-02-07 MED ORDER — OXYCODONE HCL 5 MG PO TABS
5.0000 mg | ORAL_TABLET | ORAL | Status: DC | PRN
  Administered 2022-02-08 – 2022-02-09 (×3): 5 mg via ORAL
  Filled 2022-02-07 (×5): qty 1

## 2022-02-07 MED ORDER — ALUM & MAG HYDROXIDE-SIMETH 200-200-20 MG/5ML PO SUSP
30.0000 mL | Freq: Once | ORAL | Status: AC
  Administered 2022-02-07: 30 mL via ORAL
  Filled 2022-02-07: qty 30

## 2022-02-07 MED ORDER — SODIUM CHLORIDE 0.9 % IV BOLUS
1000.0000 mL | Freq: Once | INTRAVENOUS | Status: AC
Start: 2022-02-07 — End: 2022-02-07
  Administered 2022-02-07: 1000 mL via INTRAVENOUS

## 2022-02-07 MED ORDER — GADOBUTROL 1 MMOL/ML IV SOLN
6.0000 mL | Freq: Once | INTRAVENOUS | Status: AC | PRN
  Administered 2022-02-07: 6 mL via INTRAVENOUS

## 2022-02-07 MED ORDER — MORPHINE SULFATE (PF) 2 MG/ML IV SOLN: 2.0000 mg | INTRAVENOUS | Status: DC | PRN

## 2022-02-07 MED ORDER — ACETAMINOPHEN 325 MG PO TABS
650.0000 mg | ORAL_TABLET | Freq: Four times a day (QID) | ORAL | Status: DC | PRN
  Administered 2022-02-07 – 2022-02-09 (×4): 650 mg via ORAL
  Filled 2022-02-07 (×4): qty 2

## 2022-02-07 MED ORDER — SODIUM CHLORIDE 0.9 % IV SOLN
2.0000 g | INTRAVENOUS | Status: DC
  Filled 2022-02-07: qty 20

## 2022-02-07 MED ORDER — POTASSIUM CHLORIDE 10 MEQ/100ML IV SOLN
10.0000 meq | INTRAVENOUS | Status: AC
  Administered 2022-02-07 (×2): 10 meq via INTRAVENOUS
  Filled 2022-02-07 (×2): qty 100

## 2022-02-07 MED ORDER — SODIUM CHLORIDE 0.9 % IV SOLN: INTRAVENOUS | Status: DC

## 2022-02-07 MED ORDER — ONDANSETRON HCL 4 MG/2ML IJ SOLN: 4.0000 mg | Freq: Four times a day (QID) | INTRAMUSCULAR | Status: DC | PRN

## 2022-02-07 MED ORDER — HEPARIN SODIUM (PORCINE) 5000 UNIT/ML IJ SOLN
5000.0000 [IU] | Freq: Three times a day (TID) | INTRAMUSCULAR | Status: DC
  Filled 2022-02-07: qty 1

## 2022-02-07 MED ORDER — ONDANSETRON HCL 4 MG PO TABS: 4.0000 mg | ORAL_TABLET | Freq: Four times a day (QID) | ORAL | Status: DC | PRN

## 2022-02-07 MED ORDER — SODIUM CHLORIDE 0.9 % IV SOLN
2.0000 g | INTRAVENOUS | Status: DC
  Administered 2022-02-07 – 2022-02-08 (×2): 2 g via INTRAVENOUS
  Filled 2022-02-07 (×2): qty 20

## 2022-02-07 MED ORDER — PRENATAL MULTIVITAMIN CH
1.0000 | ORAL_TABLET | Freq: Every day | ORAL | Status: DC
  Filled 2022-02-07 (×6): qty 1

## 2022-02-07 MED ORDER — CHLORHEXIDINE GLUCONATE CLOTH 2 % EX PADS: 6.0000 | MEDICATED_PAD | Freq: Once | CUTANEOUS | Status: DC

## 2022-02-07 MED ORDER — ACETAMINOPHEN 650 MG RE SUPP: 650.0000 mg | Freq: Four times a day (QID) | RECTAL | Status: DC | PRN

## 2022-02-07 MED ORDER — CHLORHEXIDINE GLUCONATE CLOTH 2 % EX PADS
6.0000 | MEDICATED_PAD | Freq: Once | CUTANEOUS | Status: AC
  Administered 2022-02-07: 6 via TOPICAL

## 2022-02-07 MED ORDER — LIDOCAINE VISCOUS HCL 2 % MT SOLN
15.0000 mL | Freq: Once | OROMUCOSAL | Status: AC
Start: 2022-02-07 — End: 2022-02-07
  Administered 2022-02-07: 15 mL via ORAL
  Filled 2022-02-07: qty 15

## 2022-02-07 NOTE — H&P (Signed)
?History and Physical  ? ? ?Patient: Tammy Singh YME:[id number] DOB: 2001/03/11 ?DOA: 02/06/2022 ?DOS: the patient was seen and examined on 02/07/2022 ?PCP: [facility]  ?Patient coming from: Home ? ?Chief Complaint:  ?Chief Complaint  ?Patient presents with  ? Abdominal Pain  ? ?HPI: Tammy Singh is a 21 y.o. female with medical history significant of history of depression, who is known longer taking Lexapro, presents to the ED with a CC ?Of abdominal pain.  Patient reports that she had stomach pain that felt like her stomach was going to explode.  She reports she also had chest and back pain.  Chest and back pain are directly related to the stomach pain.  She was at work when it started and somebody told her to make herself throw up and see if it helped.  She did, and reports that it did help.  There was no blood in her vomitus.  Patient reports that when she got home from work she was able to eat her dinner at 6 PM.  She had her last bowel movement at 3 PM.  Then pain started coming and going in waves.  The pain mostly went away when EMS gave her Zofran.  The pain was spanning across her right upper quadrant and left upper quadrant.  After the Zofran her pain is now just a dull ache in the right upper quadrant.  Patient reports she has never had anything like this before.  Patient reports no known family history of pancreatitis or familial hyperlipidemia.  Patient has no known history of gallbladder pathology.  Patient still has her appendix.  Patient reports no new medications.  She is actually not taking any medications at home.  No recent trips to the [facility].  No trauma to her abdomen.  Patient no other complaints at this time. ? ?Patient had pregnancy test done for CT scan.  Pregnancy test was positive.  Patient reports her last menstrual cycle was 6 weeks ago.  She did not know she was pregnant. ? ?Patient's only medical history is depression.  She is no longer on Lexapro.  She has had  no surgeries.  She does not smoke.  She does use alcohol, 3 shots 2 times a week.  She does not currently take drugs.  She has no known allergies.  She has no known family history. ?Review of Systems: As mentioned in the history of present illness. All other systems reviewed and are negative. ?History reviewed. No pertinent past medical history. ?History reviewed. No pertinent surgical history. ?Social History:  reports that she has never smoked. She has never used smokeless tobacco. She reports that she does not currently use alcohol. She reports that she does not currently use drugs. ? ?No Known Allergies ? ?No family history on file. ? ?Prior to Admission medications   ?Medication Sig Start Date End Date Taking? Authorizing Provider  ?escitalopram (LEXAPRO) 20 MG tablet Take 1.5 tablets (30 mg total) by mouth daily. 12/20/20   [name], MD  ? ? ?Physical Exam: ?Vitals:  ? 02/06/22 2333 02/06/22 2334 02/07/22 0030 02/07/22 0130  ?BP:  (!) 129/92 125/79 125/70  ?Pulse: 70 75 69 (!) 49  ?Resp:  20 16 16   ?Temp:  (!) 97.5 ?F (36.4 ?C)    ?TempSrc:  Oral    ?SpO2: 100% 100% 98% 100%  ?Weight:      ?Height:      ? ?1.  General: ?Patient lying supine in bed,  no acute distress ?  ?  2. Psychiatric: ?Alert and oriented x 3, mood and behavior normal for situation, pleasant and cooperative with exam ?  ?3. Neurologic: ?Speech and language are normal, face is symmetric, moves all 4 extremities voluntarily, at baseline without acute deficits on limited exam ?  ?4. HEENMT:  ?Head is atraumatic, normocephalic, pupils reactive to light, neck is supple, trachea is midline, mucous membranes are moist ?  ?5. Respiratory : ?Lungs are clear to auscultation bilaterally without wheezing, rhonchi, rales, no cyanosis, no increase in work of breathing or accessory muscle use ?  ?6. Cardiovascular : ?Heart rate normal, rhythm is regular, no murmurs, rubs or gallops, no peripheral edema, peripheral pulses palpated ?  ?7.  Gastrointestinal:  ?Abdomen is soft, nondistended, tender to palpation in the right upper quadrant, no rebound tenderness, bowel sounds active, no masses or organomegaly palpated ?  ?8. Skin:  ?Skin is warm, dry and intact without rashes, acute lesions, or ulcers on limited exam ?  ?9.Musculoskeletal:  ?No acute deformities or trauma, no asymmetry in tone, no peripheral edema, peripheral pulses palpated, no tenderness to palpation in the extremities ? ?Data Reviewed: ?In the ED ?Temp 97.5, heart rate 49-75, respiratory rate 16-20, blood pressure 125/70 satting at 100% ?Leukocytosis 15.8, hemoglobin 13.1, platelets 458 ?Lipase 607 ?Pregnancy test positive ?Borderline UTI, a urine culture pending ?1 L normal saline given ?Maalox given ? ?Assessment and Plan: ?* Pancreatitis ?- Lipase 600 ?-Right upper and left upper quadrant pain ?-Nausea and vomiting ?-No new medication/or any daily medications ?-Alcohol use is 3 shots 2 days a week ?-No known familial hyperlipidemia -check lipid panel in the a.m. ?-Right upper quadrant ultrasound in the a.m. ?-N.p.o. ?-IV fluids ?-Pain control ?-Continue to monitor ? ?First trimester pregnancy ?- Patient incidentally found to be pregnant ?-LMP 6 weeks ago ?-Quantitative hCG pending ? ?Leukocytosis ?- Likely acute phase reactant given pancreatitis ?-Possibly related to infection given borderline UA ?-Urinary frequency without dysuria ?-Check urine culture ?-No antibiotics started at this time ? ? ? ? ? Advance Care Planning:   Code Status: Not on file full ? ?Consults: None ? ?Family Communication: Boyfriend at bedside ? ?Severity of Illness: ?The appropriate patient status for this patient is INPATIENT. Inpatient status is judged to be reasonable and necessary in order to provide the required intensity of service to ensure the patient's safety. The patient's presenting symptoms, physical exam findings, and initial radiographic and laboratory data in the context of their chronic  comorbidities is felt to place them at high risk for further clinical deterioration. Furthermore, it is not anticipated that the patient will be medically stable for discharge from the hospital within 2 midnights of admission.  ? ?* I certify that at the point of admission it is my clinical judgment that the patient will require inpatient hospital care spanning beyond 2 midnights from the point of admission due to high intensity of service, high risk for further deterioration and high frequency of surveillance required.* ? ?Author: ?Rolla Plate, DO ?02/07/2022 2:27 AM ? ?For on call review [email or web address].  ?

## 2022-02-07 NOTE — ED Notes (Signed)
ED Provider at bedside. 

## 2022-02-07 NOTE — Progress Notes (Addendum)
?PROGRESS NOTE ? ? ? Enayah Overson  [id number] DOB: [birth year] DOA: 02/06/2022 ?PCP: [facility] ? ? ?Brief Narrative:  ?Breindel Huck is a 21 y.o. female with medical history significant of history of depression, who is known longer taking Lexapro, presents to the ED with a CC of abdominal pain.  Found to have acute onset pancreatitis of unclear etiology. ? ?Assessment & Plan: ?  ?Principal Problem: ?  Pancreatitis ?Active Problems: ?  Leukocytosis ? ?Acute intractable nausea, vomiting, and abdominal pain ?Secondary to acute onset pancreatitis ?Rule out biliary obstruction ?-Lipase 607 at intake, follow repeat ?-Unclear if related to occasional alcohol use/binging versus possible obstructive etiology ?-Imaging clear for obstruction, possibly cleared gallstone prior to imaging ?-Symptoms drastically improving today with IV fluids and supportive care ?-Advance diet to clears, if tolerates well will advance diet as tolerated thereafter ?-Lipid panel unremarkable ?-AST, ALT markedly elevated today, bilirubin remains within normal limits ?-GI and GEN surge sidelined, MRCP pending -May need cholecystectomy if true obstruction is noted ?-Unclear if patient has recent Tylenol use as well ?-Repeat labs this afternoon to ensure plateauing ?  ?False negative pregnancy testing ?-Quantitative hCG negative ?  ?Leukocytosis, reactive ?-No signs or symptoms of acute infection, follow above ?  ?Advance Care Planning:   Full ?Consults: None ?Family Communication: None present ? ?Status is: Inpatient ? ?Dispo: The patient is from: Home ?             Anticipated d/c is to: Home ?             Anticipated d/c date is: 24 to 48 hours pending repeat labs and clinical improvement ?             Patient currently not medically stable for discharge ? ?Consultants:  ?None ? ?Procedures:  ?None ? ?Antimicrobials:  ?None ? ?Subjective: ?No acute issues or events overnight, clinically improving denies any ongoing nausea  vomiting diarrhea constipation headache fevers chills or chest pain.  Abdominal pain improving but not yet resolved ? ?Objective: ?Vitals:  ? 02/07/22 1132 02/07/22 1133 02/07/22 1200 02/07/22 1230  ?BP:  119/63 102/67 114/84  ?Pulse: (!) 51 84 (!) 57 79  ?Resp: 11 11 13 13   ?Temp:  (!) 97.5 ?F (36.4 ?C)    ?TempSrc:  Oral    ?SpO2: 100%  100% 100%  ?Weight:      ?Height:      ? ? ?Intake/Output Summary (Last 24 hours) at 02/07/2022 1233 ?Last data filed at 02/07/2022 0308 ?Gross per 24 hour  ?Intake 1000 ml  ?Output --  ?Net 1000 ml  ? ?Filed Weights  ? 02/06/22 2332  ?Weight: 72.6 kg  ? ? ?Examination: ? ?General:  Pleasantly resting in bed, No acute distress. ?HEENT:  Normocephalic atraumatic.  Sclerae nonicteric, noninjected.  Extraocular movements intact bilaterally. ?Neck:  Without mass or deformity.  Trachea is midline. ?Lungs:  Clear to auscultate bilaterally without rhonchi, wheeze, or rales. ?Heart:  Regular rate and rhythm.  Without murmurs, rubs, or gallops. ?Abdomen:  Soft, nontender, nondistended.  Without guarding or rebound. ?Extremities: Without cyanosis, clubbing, edema, or obvious deformity. ?Vascular:  Dorsalis pedis and posterior tibial pulses palpable bilaterally. ?Skin:  Warm and dry, no erythema, no ulcerations. ? ? ?Data Reviewed: I have personally reviewed following labs and imaging studies ? ?CBC: ?Recent Labs  ?Lab 02/06/22 ?0005 02/07/22 ?[id number]  ?WBC 15.8* 8.8  ?NEUTROABS 13.4* 6.3  ?HGB 13.1 12.0  ?HCT 40.1 37.2  ?MCV 82.9 83.0  ?PLT  458* 399  ? ?Basic Metabolic Panel: ?Recent Labs  ?Lab 02/06/22 ?0005 02/07/22 ?[id number]  ?NA 138 138  ?K 3.4* 3.4*  ?CL 104 108  ?CO2 25 21*  ?GLUCOSE 112* 94  ?BUN 14 13  ?CREATININE 0.50 0.51  ?CALCIUM 9.5 8.8*  ?MG  --  2.0  ? ?GFR: ?Estimated Creatinine Clearance: 113.4 mL/min (by C-G formula based on SCr of 0.51 mg/dL). ?Liver Function Tests: ?Recent Labs  ?Lab 02/06/22 ?0005 02/07/22 ?[id number]  ?AST 36 469*  ?ALT 22 157*  ?ALKPHOS 103 96  ?BILITOT 0.8 1.2  ?PROT  7.5 6.4*  ?ALBUMIN 4.1 3.4*  ? ?Recent Labs  ?Lab 02/06/22 ?0005  ?LIPASE 607*  ? ?No results for input(s): AMMONIA in the last 168 hours. ?Coagulation Profile: ?No results for input(s): INR, PROTIME in the last 168 hours. ?Cardiac Enzymes: ?No results for input(s): CKTOTAL, CKMB, CKMBINDEX, TROPONINI in the last 168 hours. ?BNP (last 3 results) ?No results for input(s): PROBNP in the last 8760 hours. ?HbA1C: ?No results for input(s): HGBA1C in the last 72 hours. ?CBG: ?No results for input(s): GLUCAP in the last 168 hours. ?Lipid Profile: ?Recent Labs  ?  02/07/22 ?[id number]  ?CHOL 108  ?HDL 54  ?[place] 50  ?TRIG 20  ?CHOLHDL 2.0  ? ?Thyroid Function Tests: ?No results for input(s): TSH, T4TOTAL, FREET4, T3FREE, THYROIDAB in the last 72 hours. ?Anemia Panel: ?No results for input(s): VITAMINB12, FOLATE, FERRITIN, TIBC, IRON, RETICCTPCT in the last 72 hours. ?Sepsis Labs: ?No results for input(s): PROCALCITON, LATICACIDVEN in the last 168 hours. ? ?No results found for this or any previous visit (from the past 240 hour(s)).  ? ? ?Radiology Studies: ?US Abdomen Limited RUQ (LIVER/GB) ? ?Result Date: 02/07/2022 ?CLINICAL DATA:  Abdominal pain. EXAM: ULTRASOUND ABDOMEN LIMITED RIGHT UPPER QUADRANT COMPARISON:  None. FINDINGS: Gallbladder: Sludge with mild wall thickening visualized and trace pericholecystic fluid. No gallstones. Two polyps measuring up to 3 mm. Positive sonographic Murphy sign noted by sonographer. Common bile duct: Diameter: 3 mm, normal. Liver: No focal lesion identified. Within normal limits in parenchymal echogenicity. Portal vein is patent on color Doppler imaging with normal direction of blood flow towards the liver. Other: None. IMPRESSION: 1. Gallbladder sludge with mild wall thickening and trace pericholecystic fluid. Positive sonographic Murphy sign noted by sonographer. Findings are suspicious for acute cholecystitis. Consider further evaluation with HIDA scan. 2. Two 3 mm gallbladder polyps. No  further follow-up required. Electronically Signed   By: Titus Dubin M.D.   On: 02/07/2022 07:52   ? ?Scheduled Meds: ? heparin  5,000 Units Subcutaneous Q8H  ? prenatal multivitamin  1 tablet Oral Q1200  ? ?Continuous Infusions: ? sodium chloride 100 mL/hr at 02/07/22 0442  ? ? ? LOS: 0 days  ? ?Time spent: 67min ? ?Little Ishikawa, DO ?[facility] ? ?If 7PM-7AM, please contact night-coverage [email or web address] ? ?02/07/2022, 12:33 PM   ? ? ? ?

## 2022-02-07 NOTE — ED Provider Notes (Signed)
? EMERGENCY DEPARTMENT ?Provider Note ? ? ?CSN: [id number] ?Arrival date & time: 02/06/22  2331 ? ?  ? ?History ? ?Chief Complaint  ?Patient presents with  ? Abdominal Pain  ? ? ?Tammy Singh is a 21 y.o. female. ? ?HPI ? ?  ? ?This is a 21 year old female who presents with abdominal pain.  Patient reports onset of symptoms while at work yesterday.  She began to have epigastric pain that radiated to her back.  It was worse with eating.  She made herself vomit and had some relief.  She subsequently took Tums and Pepto-Bismol with minimal relief.  Pain is in the epigastrium and right upper quadrant.  It is sharp in nature.  She had a subsequent episode of nonbilious, nonbloody emesis with EMS.  She was given Zofran.  Patient denies urinary symptoms.  She does not believe herself to be pregnant. ? ?Home Medications ?Prior to Admission medications   ?Medication Sig Start Date End Date Taking? Authorizing Provider  ?escitalopram (LEXAPRO) 20 MG tablet Take 1.5 tablets (30 mg total) by mouth daily. 12/20/20   Thresa RossAkhtar, Nadeem, MD  ?   ? ?Allergies    ?Patient has no known allergies.   ? ?Review of Systems   ?Review of Systems  ?Constitutional:  Negative for fever.  ?Respiratory:  Negative for shortness of breath.   ?Cardiovascular:  Negative for chest pain.  ?Gastrointestinal:  Positive for abdominal pain, nausea and vomiting. Negative for constipation and diarrhea.  ?All other systems reviewed and are negative. ? ?Physical Exam ?Updated Vital Signs ?BP 125/79   Pulse 69   Temp (!) 97.5 ?F (36.4 ?C) (Oral)   Resp 16   Ht 1.676 m (5\' 6" )   Wt 72.6 kg   SpO2 98%   BMI 25.82 kg/m?  ?Physical Exam ?Vitals and nursing note reviewed.  ?Constitutional:   ?   Appearance: She is well-developed. She is not ill-appearing.  ?HENT:  ?   Head: Normocephalic and atraumatic.  ?Eyes:  ?   Pupils: Pupils are equal, round, and reactive to light.  ?Cardiovascular:  ?   Rate and Rhythm: Normal rate and regular rhythm.  ?    Heart sounds: Normal heart sounds.  ?Pulmonary:  ?   Effort: Pulmonary effort is normal. No respiratory distress.  ?   Breath sounds: No wheezing.  ?Abdominal:  ?   General: Bowel sounds are normal.  ?   Palpations: Abdomen is soft.  ?   Tenderness: There is abdominal tenderness in the right upper quadrant and epigastric area. There is no guarding or rebound.  ?Musculoskeletal:  ?   Cervical back: Neck supple.  ?Skin: ?   General: Skin is warm and dry.  ?Neurological:  ?   Mental Status: She is alert and oriented to person, place, and time.  ?Psychiatric:     ?   Mood and Affect: Mood normal.  ? ? ?ED Results / Procedures / Treatments   ?Labs ?(all labs ordered are listed, but only abnormal results are displayed) ?Labs Reviewed  ?CBC WITH DIFFERENTIAL/PLATELET - Abnormal; Notable for the following components:  ?    Result Value  ? WBC 15.8 (*)   ? Platelets 458 (*)   ? Neutro Abs 13.4 (*)   ? All other components within normal limits  ?COMPREHENSIVE METABOLIC PANEL - Abnormal; Notable for the following components:  ? Potassium 3.4 (*)   ? Glucose, Bld 112 (*)   ? All other components within normal limits  ?  LIPASE, BLOOD - Abnormal; Notable for the following components:  ? Lipase 607 (*)   ? All other components within normal limits  ?URINALYSIS, ROUTINE W REFLEX MICROSCOPIC - Abnormal; Notable for the following components:  ? APPearance HAZY (*)   ? Ketones, ur 5 (*)   ? Protein, ur 100 (*)   ? Leukocytes,Ua TRACE (*)   ? Bacteria, UA RARE (*)   ? All other components within normal limits  ?PREGNANCY, URINE - Abnormal; Notable for the following components:  ? Preg Test, Ur POSITIVE (*)   ? All other components within normal limits  ?HCG, QUANTITATIVE, PREGNANCY  ? ? ?EKG ?None ? ?Radiology ?No results found. ? ?Procedures ?Procedures  ? ? ?Medications Ordered in ED ?Medications  ?alum & mag hydroxide-simeth (MAALOX/MYLANTA) 200-200-20 MG/5ML suspension 30 mL (30 mLs Oral Given 02/07/22 0054)  ?  And  ?lidocaine  (XYLOCAINE) 2 % viscous mouth solution 15 mL (15 mLs Oral Given 02/07/22 0054)  ?sodium chloride 0.9 % bolus 1,000 mL (1,000 mLs Intravenous New Bag/Given 02/07/22 0127)  ? ? ?ED Course/ Medical Decision Making/ A&P ?  ?                        ?Medical Decision Making ?Amount and/or Complexity of Data Reviewed ?Labs: ordered. ? ?Risk ?OTC drugs. ?Prescription drug management. ?Decision regarding hospitalization. ? ? ?This patient presents to the ED for concern of abdominal pain, this involves an extensive number of treatment options, and is a complaint that carries with it a high risk of complications and morbidity.  The differential diagnosis includes pancreatitis, cholecystitis, gastritis, less likely appendicitis ? ?MDM:   ? ?This is a 21 year old female who presents with abdominal pain.  It is epigastric and right upper quadrant.  She is nontoxic and vital signs are largely reassuring.  She is objectively tender.  Patient was given a GI cocktail.  Labs obtained.  Labs notable for leukocytosis to 15.  No significant metabolic derangement or LFT abnormality.  Lipase is greater than 600.  Given location of pain, suspect pancreatitis and likely gallstone pancreatitis.  Patient denies any alcohol use.  Of note, urine pregnancy test is positive.  On reevaluation, patient was informed of this result.  She is unsure when her last menstrual period was but thinks it may have been about 6 weeks ago.  She has had 1 prior uncomplicated pregnancy. ?(Labs, imaging) ? ?Labs: ?I Ordered, and personally interpreted labs.  The pertinent results include: CBC, CMP, lipase, urinalysis, urine pregnancy.  Lipase greater than 600, leukocytosis to 15, urine pregnancy positive ? ?Imaging Studies ordered: ?I ordered imaging studies including will need ultrasound ?I independently visualized and interpreted imaging. ?I agree with the radiologist interpretation ? ?Additional history obtained from boyfriend at bedside.  External records from  outside source obtained and reviewed including evaluations ? ?Critical Interventions: ?IV fluids, Zofran ? ?Consultations: ?I requested consultation with the hospitalist,  and discussed lab and imaging findings as well as pertinent plan - they recommend: Admission ? ?Cardiac Monitoring: ?The patient was maintained on a cardiac monitor.  I personally viewed and interpreted the cardiac monitored which showed an underlying rhythm of: Normal sinus rhythm ? ?Reevaluation: ?After the interventions noted above, I reevaluated the patient and found that they have :improved ? ? ?Considered admission for: Hydration, pancreatitis ? ?Social Determinants of Health: ?Pregnant ? ?Disposition: Admit ? ?Co morbidities that complicate the patient evaluation ?History reviewed. No pertinent past medical history.  ? ?  Medicines ?Meds ordered this encounter  ?Medications  ? AND Linked Order Group  ?  alum & mag hydroxide-simeth (MAALOX/MYLANTA) 200-200-20 MG/5ML suspension 30 mL  ?  lidocaine (XYLOCAINE) 2 % viscous mouth solution 15 mL  ? sodium chloride 0.9 % bolus 1,000 mL  ?  ?I have reviewed the patients home medicines and have made adjustments as needed ? ?Problem List / ED Course: ?Problem List Items Addressed This Visit   ?None ?Visit Diagnoses   ? ? Acute pancreatitis, unspecified complication status, unspecified pancreatitis type    -  Primary  ? Pregnancy, unspecified gestational age      ? ?  ?  ? ? ? ? ? ? ? ? ? ? ? ? ?Final Clinical Impression(s) / ED Diagnoses ?Final diagnoses:  ?Acute pancreatitis, unspecified complication status, unspecified pancreatitis type  ?Pregnancy, unspecified gestational age  ? ? ?Rx / DC Orders ?ED Discharge Orders   ? ? None  ? ?  ? ? ?  ?Shon Baton, MD ?02/07/22 [phone] ? ?

## 2022-02-07 NOTE — H&P (View-Only) (Signed)
[facility] Consult ? ?Reason for Consult: Gallstone pancreatitis / cholecystitis  ?Referring Physician:  Dr. Natale Milch  ? ?Chief Complaint   ?Abdominal Pain ?  ? ? ?HPI: Tammy Singh is a 21 y.o. female with no other medical issues who comes in with complaints of abdominal pain that started at 1Am prior to her arrival. She had a few alcoholic drinks over the weekend (4ish) but nothing major and was not drunk. She was admitted with pancreatitis and presumed to be from alcohol but she had an [place] today with some wall thickening and sludge. MRCP was done that demonstrated no overt pancreatitis on imaging and no signs of CBD dilation. She has some pericholecystic fluid and thickening.  ? ?History reviewed. No pertinent past medical history. ? ?History reviewed. No pertinent surgical history. ? ?Family History  ?Problem Relation Age of Onset  ? Breast cancer Maternal Grandmother   ? ? ?Social History  ? ?Tobacco Use  ? Smoking status: Never  ? Smokeless tobacco: Never  ?Vaping Use  ? Vaping Use: Every day  ?Substance Use Topics  ? Alcohol use: Not Currently  ? Drug use: Not Currently  ? ? ?Medications: I have reviewed the patient's current medications. ?Prior to Admission: (Not in a hospital admission)  ?Scheduled: ? heparin  5,000 Units Subcutaneous Q8H  ? prenatal multivitamin  1 tablet Oral Q1200  ? ?Continuous: ? sodium chloride 100 mL/hr at 02/07/22 0442  ? cefTRIAXone (ROCEPHIN)  IV    ? ?JKK:[id number] **OR** acetaminophen, morphine injection, ondansetron **OR** ondansetron (ZOFRAN) IV, oxyCODONE ? ?No Known Allergies ? ? ?ROS:  ?A comprehensive review of systems was negative except for: Gastrointestinal: positive for abdominal pain and nausea ? ?Blood pressure 114/84, pulse 79, temperature (!) 97.5 ?F (36.4 ?C), temperature source Oral, resp. rate 13, height 5\' 6"  (1.676 m), weight 72.6 kg, SpO2 100 %. ?Physical Exam ?Vitals reviewed.  ?Constitutional:   ?   Appearance: She is  well-developed.  ?HENT:  ?   Head: Normocephalic.  ?Eyes:  ?   Extraocular Movements: Extraocular movements intact.  ?Cardiovascular:  ?   Rate and Rhythm: Normal rate.  ?Pulmonary:  ?   Effort: Pulmonary effort is normal.  ?Abdominal:  ?   General: There is no distension.  ?   Palpations: Abdomen is soft.  ?   Tenderness: There is abdominal tenderness in the right upper quadrant and epigastric area.  ?Musculoskeletal:  ?   Comments: Moves all extremities   ?Skin: ?   General: Skin is warm.  ?Neurological:  ?   General: No focal deficit present.  ?   Mental Status: She is alert and oriented to person, place, and time.  ? ? ?Results: ?Results for orders placed or performed during the hospital encounter of 02/06/22 (from the past 48 hour(s))  ?CBC with Differential     Status: Abnormal  ? Collection Time: 02/06/22 12:05 AM  ?Result Value Ref Range  ? WBC 15.8 (H) 4.0 - 10.5 K/uL  ? RBC 4.84 3.87 - 5.11 MIL/uL  ? Hemoglobin 13.1 12.0 - 15.0 g/dL  ? HCT 40.1 36.0 - 46.0 %  ? MCV 82.9 80.0 - 100.0 fL  ? MCH 27.1 26.0 - 34.0 pg  ? MCHC 32.7 30.0 - 36.0 g/dL  ? RDW 14.1 11.5 - 15.5 %  ? Platelets 458 (H) 150 - 400 K/uL  ? nRBC 0.0 0.0 - 0.2 %  ? Neutrophils Relative % 85 %  ? Neutro Abs 13.4 (H) 1.7 -  7.7 K/uL  ? Lymphocytes Relative 10 %  ? Lymphs Abs 1.6 0.7 - 4.0 K/uL  ? Monocytes Relative 5 %  ? Monocytes Absolute 0.8 0.1 - 1.0 K/uL  ? Eosinophils Relative 0 %  ? Eosinophils Absolute 0.0 0.0 - 0.5 K/uL  ? Basophils Relative 0 %  ? Basophils Absolute 0.0 0.0 - 0.1 K/uL  ? Immature Granulocytes 0 %  ? Abs Immature Granulocytes 0.04 0.00 - 0.07 K/uL  ?  Comment: Performed at [facility], [address] [place], [place] [address]  ?Comprehensive metabolic panel     Status: Abnormal  ? Collection Time: 02/06/22 12:05 AM  ?Result Value Ref Range  ? Sodium 138 135 - 145 mmol/L  ? Potassium 3.4 (L) 3.5 - 5.1 mmol/L  ? Chloride 104 98 - 111 mmol/L  ? CO2 25 22 - 32 mmol/L  ? Glucose, Bld 112 (H) 70 - 99 mg/dL  ?  Comment:  Glucose reference range applies only to samples taken after fasting for at least 8 hours.  ? BUN 14 6 - 20 mg/dL  ? Creatinine, Ser 0.50 0.44 - 1.00 mg/dL  ? Calcium 9.5 8.9 - 10.3 mg/dL  ? Total Protein 7.5 6.5 - 8.1 g/dL  ? Albumin 4.1 3.5 - 5.0 g/dL  ? AST 36 15 - 41 U/L  ? ALT 22 0 - 44 U/L  ? Alkaline Phosphatase 103 38 - 126 U/L  ? Total Bilirubin 0.8 0.3 - 1.2 mg/dL  ? GFR, Estimated >60 >60 mL/min  ?  Comment: (NOTE) ?Calculated using the CKD-EPI Creatinine Equation (2021) ?  ? Anion gap 9 5 - 15  ?  Comment: Performed at [facility], [address] [place], [place] [address]  ?Lipase, blood     Status: Abnormal  ? Collection Time: 02/06/22 12:05 AM  ?Result Value Ref Range  ? Lipase 607 (H) 11 - 51 U/L  ?  Comment: RESULTS CONFIRMED BY MANUAL DILUTION ?Performed at [facility], [address] [place], [place] [address] ?  ?Urinalysis, Routine w reflex microscopic Urine, Clean Catch     Status: Abnormal  ? Collection Time: 02/06/22 11:58 PM  ?Result Value Ref Range  ? Color, Urine YELLOW YELLOW  ? APPearance HAZY (A) CLEAR  ? Specific Gravity, Urine 1.024 1.005 - 1.030  ? pH 7.0 5.0 - 8.0  ? Glucose, UA NEGATIVE NEGATIVE mg/dL  ? Hgb urine dipstick NEGATIVE NEGATIVE  ? Bilirubin Urine NEGATIVE NEGATIVE  ? Ketones, ur 5 (A) NEGATIVE mg/dL  ? Protein, ur 100 (A) NEGATIVE mg/dL  ? Nitrite NEGATIVE NEGATIVE  ? Leukocytes,Ua TRACE (A) NEGATIVE  ? RBC / HPF 0-5 0 - 5 RBC/hpf  ? WBC, UA 0-5 0 - 5 WBC/hpf  ? Bacteria, UA RARE (A) NONE SEEN  ? Squamous Epithelial / LPF 6-10 0 - 5  ? Mucus PRESENT   ?  Comment: Performed at [facility], [address] [place], [place] [address]  ?Pregnancy, urine     Status: None  ? Collection Time: 02/06/22 11:58 PM  ?Result Value Ref Range  ? Preg Test, Ur NEGATIVE NEGATIVE  ?  Comment: AMENDED REPORT ?Brame,M@0416  by MatthewsB 4.5.2023 ?       ?THE SENSITIVITY OF THIS ?METHODOLOGY IS >20 mIU/mL. ?Performed at [facility], [address] [place], [place] [address] ?CORRECTED ON  04/05 AT 0419: PREVIOUSLY REPORTED AS POSITIVE        THE SENSITIVITY OF THIS METHODOLOGY IS >20 mIU/mL. ?  ?hCG, quantitative,  pregnancy     Status: None  ? Collection Time: 02/07/22 12:05 AM  ?Result Value Ref Range  ? hCG, Beta Chain, Quant, S <1 <5 mIU/mL  ?  Comment:        ?  GEST. AGE      CONC.  (mIU/mL) ?  <=1 WEEK        5 - 50 ?    2 WEEKS       50 - 500 ?    3 WEEKS       100 - 10,000 ?    4 WEEKS     1,000 - 30,000 ?    5 WEEKS     3,500 - 115,000 ?  6-8 WEEKS     12,000 - 270,000 ?   12 WEEKS     15,000 - 220,000 ?       ?FEMALE AND NON-PREGNANT FEMALE: ?    LESS THAN 5 mIU/mL ?Performed at [facility], [address], [place], [place] [address] ?  ?HIV Antibody (routine testing w rflx)     Status: None  ? Collection Time: 02/07/22  3:47 AM  ?Result Value Ref Range  ? HIV Screen 4th Generation wRfx Non Reactive Non Reactive  ?  Comment: Performed at [facility], [address], [place], [place] [address]  ?Lipid panel     Status: None  ? Collection Time: 02/07/22  3:47 AM  ?Result Value Ref Range  ? Cholesterol 108 0 - 200 mg/dL  ? Triglycerides 20 <150 mg/dL  ? HDL 54 >40 mg/dL  ? Total CHOL/HDL Ratio 2.0 RATIO  ? VLDL 4 0 - 40 mg/dL  ? LDL Cholesterol 50 0 - 99 mg/dL  ?  Comment:        ?Total Cholesterol/HDL:CHD Risk ?Coronary Heart Disease Risk Table ?                    Men   Women ? 1/2 Average Risk   3.4   3.3 ? Average Risk       5.0   4.4 ? 2 X Average Risk   9.6   7.1 ? 3 X Average Risk  23.4   11.0 ?       ?Use the calculated Patient Ratio ?above and the CHD Risk Table ?to determine the patient's CHD Risk. ?       ?ATP III CLASSIFICATION (LDL): ? <100     mg/dL   Optimal ? 563-149  mg/dL   Near or Above ?                   Optimal ? 130-159  mg/dL   Borderline ? 160-189  mg/dL   High ? >702     mg/dL   Very High ?Performed at [facility], [address], [place], [place] [address] ?  ?Comprehensive metabolic panel     Status: Abnormal  ? Collection Time: 02/07/22  3:47 AM  ?Result Value  Ref Range  ? Sodium 138 135 - 145 mmol/L  ? Potassium 3.4 (L) 3.5 - 5.1 mmol/L  ? Chloride 108 98 - 111 mmol/L  ? CO2 21 (L) 22 - 32 mmol/L  ? Glucose, Bld 94 70 - 99 mg/dL  ?  Comment: Glucose reference range appli

## 2022-02-07 NOTE — Assessment & Plan Note (Addendum)
-   Patient incidentally found to be pregnant ?-LMP 6 weeks ago ?-Quantitative hCG pending ?-Start prenatals ?-Counseled on alcohol cessation ?

## 2022-02-07 NOTE — Assessment & Plan Note (Signed)
-   Likely acute phase reactant given pancreatitis ?-Possibly related to infection given borderline UA ?-Urinary frequency without dysuria ?-Check urine culture ?-No antibiotics started at this time ?

## 2022-02-07 NOTE — Assessment & Plan Note (Signed)
-   Lipase 600 ?-Right upper and left upper quadrant pain ?-Nausea and vomiting ?-No new medication/or any daily medications ?-Alcohol use is 3 shots 2 days a week ?-No known familial hyperlipidemia -check lipid panel in the a.m. ?-Right upper quadrant ultrasound in the a.m. ?-N.p.o. ?-IV fluids ?-Pain control ?-Continue to monitor ?

## 2022-02-07 NOTE — Consult Note (Signed)
[facility] Consult ? ?Reason for Consult: Gallstone pancreatitis / cholecystitis  ?Referring Physician:  Dr. Natale Milch  ? ?Chief Complaint   ?Abdominal Pain ?  ? ? ?HPI: Tammy Singh is a 21 y.o. female with no other medical issues who comes in with complaints of abdominal pain that started at 1Am prior to her arrival. She had a few alcoholic drinks over the weekend (4ish) but nothing major and was not drunk. She was admitted with pancreatitis and presumed to be from alcohol but she had an [place] today with some wall thickening and sludge. MRCP was done that demonstrated no overt pancreatitis on imaging and no signs of CBD dilation. She has some pericholecystic fluid and thickening.  ? ?History reviewed. No pertinent past medical history. ? ?History reviewed. No pertinent surgical history. ? ?Family History  ?Problem Relation Age of Onset  ? Breast cancer Maternal Grandmother   ? ? ?Social History  ? ?Tobacco Use  ? Smoking status: Never  ? Smokeless tobacco: Never  ?Vaping Use  ? Vaping Use: Every day  ?Substance Use Topics  ? Alcohol use: Not Currently  ? Drug use: Not Currently  ? ? ?Medications: I have reviewed the patient's current medications. ?Prior to Admission: (Not in a hospital admission)  ?Scheduled: ? heparin  5,000 Units Subcutaneous Q8H  ? prenatal multivitamin  1 tablet Oral Q1200  ? ?Continuous: ? sodium chloride 100 mL/hr at 02/07/22 0442  ? cefTRIAXone (ROCEPHIN)  IV    ? ?JKK:XFGHWEXHBZJIR **OR** acetaminophen, morphine injection, ondansetron **OR** ondansetron (ZOFRAN) IV, oxyCODONE ? ?No Known Allergies ? ? ?ROS:  ?A comprehensive review of systems was negative except for: Gastrointestinal: positive for abdominal pain and nausea ? ?Blood pressure 114/84, pulse 79, temperature (!) 97.5 ?F (36.4 ?C), temperature source Oral, resp. rate 13, height 5\' 6"  (1.676 m), weight 72.6 kg, SpO2 100 %. ?Physical Exam ?Vitals reviewed.  ?Constitutional:   ?   Appearance: She is  well-developed.  ?HENT:  ?   Head: Normocephalic.  ?Eyes:  ?   Extraocular Movements: Extraocular movements intact.  ?Cardiovascular:  ?   Rate and Rhythm: Normal rate.  ?Pulmonary:  ?   Effort: Pulmonary effort is normal.  ?Abdominal:  ?   General: There is no distension.  ?   Palpations: Abdomen is soft.  ?   Tenderness: There is abdominal tenderness in the right upper quadrant and epigastric area.  ?Musculoskeletal:  ?   Comments: Moves all extremities   ?Skin: ?   General: Skin is warm.  ?Neurological:  ?   General: No focal deficit present.  ?   Mental Status: She is alert and oriented to person, place, and time.  ? ? ?Results: ?Results for orders placed or performed during the hospital encounter of 02/06/22 (from the past 48 hour(s))  ?CBC with Differential     Status: Abnormal  ? Collection Time: 02/06/22 12:05 AM  ?Result Value Ref Range  ? WBC 15.8 (H) 4.0 - 10.5 K/uL  ? RBC 4.84 3.87 - 5.11 MIL/uL  ? Hemoglobin 13.1 12.0 - 15.0 g/dL  ? HCT 40.1 36.0 - 46.0 %  ? MCV 82.9 80.0 - 100.0 fL  ? MCH 27.1 26.0 - 34.0 pg  ? MCHC 32.7 30.0 - 36.0 g/dL  ? RDW 14.1 11.5 - 15.5 %  ? Platelets 458 (H) 150 - 400 K/uL  ? nRBC 0.0 0.0 - 0.2 %  ? Neutrophils Relative % 85 %  ? Neutro Abs 13.4 (H) 1.7 -  7.7 K/uL  ? Lymphocytes Relative 10 %  ? Lymphs Abs 1.6 0.7 - 4.0 K/uL  ? Monocytes Relative 5 %  ? Monocytes Absolute 0.8 0.1 - 1.0 K/uL  ? Eosinophils Relative 0 %  ? Eosinophils Absolute 0.0 0.0 - 0.5 K/uL  ? Basophils Relative 0 %  ? Basophils Absolute 0.0 0.0 - 0.1 K/uL  ? Immature Granulocytes 0 %  ? Abs Immature Granulocytes 0.04 0.00 - 0.07 K/uL  ?  Comment: Performed at [facility], [address] [place], [place] [address]  ?Comprehensive metabolic panel     Status: Abnormal  ? Collection Time: 02/06/22 12:05 AM  ?Result Value Ref Range  ? Sodium 138 135 - 145 mmol/L  ? Potassium 3.4 (L) 3.5 - 5.1 mmol/L  ? Chloride 104 98 - 111 mmol/L  ? CO2 25 22 - 32 mmol/L  ? Glucose, Bld 112 (H) 70 - 99 mg/dL  ?  Comment:  Glucose reference range applies only to samples taken after fasting for at least 8 hours.  ? BUN 14 6 - 20 mg/dL  ? Creatinine, Ser 0.50 0.44 - 1.00 mg/dL  ? Calcium 9.5 8.9 - 10.3 mg/dL  ? Total Protein 7.5 6.5 - 8.1 g/dL  ? Albumin 4.1 3.5 - 5.0 g/dL  ? AST 36 15 - 41 U/L  ? ALT 22 0 - 44 U/L  ? Alkaline Phosphatase 103 38 - 126 U/L  ? Total Bilirubin 0.8 0.3 - 1.2 mg/dL  ? GFR, Estimated >60 >60 mL/min  ?  Comment: (NOTE) ?Calculated using the CKD-EPI Creatinine Equation (2021) ?  ? Anion gap 9 5 - 15  ?  Comment: Performed at [facility], [address] [place], [place] [address]  ?Lipase, blood     Status: Abnormal  ? Collection Time: 02/06/22 12:05 AM  ?Result Value Ref Range  ? Lipase 607 (H) 11 - 51 U/L  ?  Comment: RESULTS CONFIRMED BY MANUAL DILUTION ?Performed at  [facility], [address] [place], [place] [address] ?  ?Urinalysis, Routine w reflex microscopic Urine, Clean Catch     Status: Abnormal  ? Collection Time: 02/06/22 11:58 PM  ?Result Value Ref Range  ? Color, Urine YELLOW YELLOW  ? APPearance HAZY (A) CLEAR  ? Specific Gravity, Urine 1.024 1.005 - 1.030  ? pH 7.0 5.0 - 8.0  ? Glucose, UA NEGATIVE NEGATIVE mg/dL  ? Hgb urine dipstick NEGATIVE NEGATIVE  ? Bilirubin Urine NEGATIVE NEGATIVE  ? Ketones, ur 5 (A) NEGATIVE mg/dL  ? Protein, ur 100 (A) NEGATIVE mg/dL  ? Nitrite NEGATIVE NEGATIVE  ? Leukocytes,Ua TRACE (A) NEGATIVE  ? RBC / HPF 0-5 0 - 5 RBC/hpf  ? WBC, UA 0-5 0 - 5 WBC/hpf  ? Bacteria, UA RARE (A) NONE SEEN  ? Squamous Epithelial / LPF 6-10 0 - 5  ? Mucus PRESENT   ?  Comment: Performed at [facility], [address] , [place] [address]  ?Pregnancy, urine     Status: None  ? Collection Time: 02/06/22 11:58 PM  ?Result Value Ref Range  ? Preg Test, Ur NEGATIVE NEGATIVE  ?  Comment: AMENDED REPORT ?Brame,M@[email or web address] by MatthewsB 4.5.2023 ?       ?THE SENSITIVITY OF THIS ?METHODOLOGY IS >20 mIU/mL. ?Performed at [facility], [address] , [place] [address] ?CORRECTED ON  04/05 AT 0419: PREVIOUSLY REPORTED AS POSITIVE        THE SENSITIVITY OF THIS METHODOLOGY IS >20 mIU/mL. ?  ?hCG, quantitative,   pregnancy     Status: None  ? Collection Time: 02/07/22 12:05 AM  ?Result Value Ref Range  ? hCG, Beta Chain, Quant, S <1 <5 mIU/mL  ?  Comment:        ?  GEST. AGE      CONC.  (mIU/mL) ?  <=1 WEEK        5 - 50 ?    2 WEEKS       50 - 500 ?    3 WEEKS       100 - 10,000 ?    4 WEEKS     1,000 - 30,000 ?    5 WEEKS     3,500 - 115,000 ?  6-8 WEEKS     12,000 - 270,000 ?   12 WEEKS     15,000 - 220,000 ?       ?FEMALE AND NON-PREGNANT FEMALE: ?    LESS THAN 5 mIU/mL ?Performed at [facility], [address], [place], [place] [address] ?  ?HIV Antibody (routine testing w rflx)     Status: None  ? Collection Time: 02/07/22  3:47 AM  ?Result Value Ref Range  ? HIV Screen 4th Generation wRfx Non Reactive Non Reactive  ?  Comment: Performed at [facility], [address], [place], [place] [address]  ?Lipid panel     Status: None  ? Collection Time: 02/07/22  3:47 AM  ?Result Value Ref Range  ? Cholesterol 108 0 - 200 mg/dL  ? Triglycerides 20 <150 mg/dL  ? HDL 54 >40 mg/dL  ? Total CHOL/HDL Ratio 2.0 RATIO  ? VLDL 4 0 - 40 mg/dL  ? LDL Cholesterol 50 0 - 99 mg/dL  ?  Comment:        ?Total Cholesterol/HDL:CHD Risk ?Coronary Heart Disease Risk Table ?                    Men   Women ? 1/2 Average Risk   3.4   3.3 ? Average Risk       5.0   4.4 ? 2 X Average Risk   9.6   7.1 ? 3 X Average Risk  23.4   11.0 ?       ?Use the calculated Patient Ratio ?above and the CHD Risk Table ?to determine the patient's CHD Risk. ?       ?ATP III CLASSIFICATION (LDL): ? <100     mg/dL   Optimal ? 563-149  mg/dL   Near or Above ?                   Optimal ? 130-159  mg/dL   Borderline ? 160-189  mg/dL   High ? >702     mg/dL   Very High ?Performed at [facility], [address], [place], [place] [address] ?  ?Comprehensive metabolic panel     Status: Abnormal  ? Collection Time: 02/07/22  3:47 AM  ?Result Value  Ref Range  ? Sodium 138 135 - 145 mmol/L  ? Potassium 3.4 (L) 3.5 - 5.1 mmol/L  ? Chloride 108 98 - 111 mmol/L  ? CO2 21 (L) 22 - 32 mmol/L  ? Glucose, Bld 94 70 - 99 mg/dL  ?  Comment: Glucose reference range appli

## 2022-02-07 NOTE — ED Notes (Signed)
Patient c/o headache, Tylenol 650 mg given. ?

## 2022-02-07 NOTE — ED Notes (Signed)
Pt ambulated to restroom. 

## 2022-02-08 ENCOUNTER — Other Ambulatory Visit: Payer: Self-pay

## 2022-02-08 ENCOUNTER — Encounter (HOSPITAL_COMMUNITY): Payer: Self-pay | Admitting: Family Medicine

## 2022-02-08 ENCOUNTER — Inpatient Hospital Stay (HOSPITAL_COMMUNITY): Payer: Medicaid Other | Admitting: Anesthesiology

## 2022-02-08 ENCOUNTER — Encounter (HOSPITAL_COMMUNITY)
Admission: EM | Disposition: A | Discharge status: Home / Self Care | Payer: Self-pay | Source: Home / Self Care | Attending: General Surgery

## 2022-02-08 DIAGNOSIS — K8000 Calculus of gallbladder with acute cholecystitis without obstruction: Secondary | ICD-10-CM

## 2022-02-08 DIAGNOSIS — K810 Acute cholecystitis: Secondary | ICD-10-CM

## 2022-02-08 DIAGNOSIS — K851 Biliary acute pancreatitis without necrosis or infection: Secondary | ICD-10-CM

## 2022-02-08 DIAGNOSIS — K8020 Calculus of gallbladder without cholecystitis without obstruction: Secondary | ICD-10-CM

## 2022-02-08 HISTORY — PX: CHOLECYSTECTOMY: SHX55

## 2022-02-08 LAB — URINE CULTURE

## 2022-02-08 LAB — COMPREHENSIVE METABOLIC PANEL
ALT: 98 U/L — ABNORMAL HIGH (ref 0–44)
AST: 55 U/L — ABNORMAL HIGH (ref 15–41)
Albumin: 3.2 g/dL — ABNORMAL LOW (ref 3.5–5.0)
Alkaline Phosphatase: 88 U/L (ref 38–126)
Anion gap: 9 (ref 5–15)
BUN: 7 mg/dL (ref 6–20)
CO2: 22 mmol/L (ref 22–32)
Calcium: 8.9 mg/dL (ref 8.9–10.3)
Chloride: 108 mmol/L (ref 98–111)
Creatinine, Ser: 0.65 mg/dL (ref 0.44–1.00)
GFR, Estimated: >60 mL/min (ref >60)
Glucose, Bld: 85 mg/dL (ref 70–99)
Potassium: 3.9 mmol/L (ref 3.5–5.1)
Sodium: 139 mmol/L (ref 135–145)
Total Bilirubin: 1.1 mg/dL (ref 0.3–1.2)
Total Protein: 6 g/dL — ABNORMAL LOW (ref 6.5–8.1)

## 2022-02-08 LAB — CBC
HCT: 35 % — ABNORMAL LOW (ref 36.0–46.0)
Hemoglobin: 10.9 g/dL — ABNORMAL LOW (ref 12.0–15.0)
MCH: 27 pg (ref 26.0–34.0)
MCHC: 31.1 g/dL (ref 30.0–36.0)
MCV: 86.6 fL (ref 80.0–100.0)
Platelets: 324 10*3/uL (ref 150–400)
RBC: 4.04 MIL/uL (ref 3.87–5.11)
RDW: 14.4 % (ref 11.5–15.5)
WBC: 4.9 10*3/uL (ref 4.0–10.5)
nRBC: 0 % (ref 0.0–0.2)

## 2022-02-08 LAB — SURGICAL PCR SCREEN
MRSA, PCR: NEGATIVE
Staphylococcus aureus: NEGATIVE

## 2022-02-08 LAB — LIPASE, BLOOD: Lipase: 33 U/L (ref 11–51)

## 2022-02-08 SURGERY — LAPAROSCOPIC CHOLECYSTECTOMY
Anesthesia: General | Site: Abdomen

## 2022-02-08 MED ORDER — DEXMEDETOMIDINE (PRECEDEX) IN NS 20 MCG/5ML (4 MCG/ML) IV SYRINGE
PREFILLED_SYRINGE | INTRAVENOUS | Status: AC
  Filled 2022-02-08: qty 5

## 2022-02-08 MED ORDER — FENTANYL CITRATE (PF) 250 MCG/5ML IJ SOLN
INTRAMUSCULAR | Status: DC | PRN
  Administered 2022-02-08: 50 ug via INTRAVENOUS
  Administered 2022-02-08: 100 ug via INTRAVENOUS
  Administered 2022-02-08 (×4): 50 ug via INTRAVENOUS

## 2022-02-08 MED ORDER — SEVOFLURANE IN SOLN
RESPIRATORY_TRACT | Status: AC
  Filled 2022-02-08: qty 250

## 2022-02-08 MED ORDER — LIDOCAINE 2% (20 MG/ML) 5 ML SYRINGE
INTRAMUSCULAR | Status: DC | PRN
  Administered 2022-02-08: 80 mg via INTRAVENOUS

## 2022-02-08 MED ORDER — PROPOFOL 10 MG/ML IV BOLUS
INTRAVENOUS | Status: DC | PRN
  Administered 2022-02-08: 160 mg via INTRAVENOUS

## 2022-02-08 MED ORDER — SUGAMMADEX SODIUM 200 MG/2ML IV SOLN
INTRAVENOUS | Status: DC | PRN
  Administered 2022-02-08: 200 mg via INTRAVENOUS

## 2022-02-08 MED ORDER — MIDAZOLAM HCL 2 MG/2ML IJ SOLN
INTRAMUSCULAR | Status: AC
  Filled 2022-02-08: qty 2

## 2022-02-08 MED ORDER — ONDANSETRON HCL 4 MG/2ML IJ SOLN
INTRAMUSCULAR | Status: AC
  Filled 2022-02-08: qty 2

## 2022-02-08 MED ORDER — PROPOFOL 10 MG/ML IV BOLUS
INTRAVENOUS | Status: AC
  Filled 2022-02-08: qty 20

## 2022-02-08 MED ORDER — MEPERIDINE HCL 50 MG/ML IJ SOLN: 6.2500 mg | INTRAMUSCULAR | Status: DC | PRN

## 2022-02-08 MED ORDER — DEXMEDETOMIDINE (PRECEDEX) IN NS 20 MCG/5ML (4 MCG/ML) IV SYRINGE
PREFILLED_SYRINGE | INTRAVENOUS | Status: DC | PRN
  Administered 2022-02-08 (×4): 4 ug via INTRAVENOUS

## 2022-02-08 MED ORDER — BUPIVACAINE LIPOSOME 1.3 % IJ SUSP
INTRAMUSCULAR | Status: DC | PRN
  Administered 2022-02-08: 20 mL

## 2022-02-08 MED ORDER — ORAL CARE MOUTH RINSE: 15.0000 mL | Freq: Once | OROMUCOSAL | Status: AC

## 2022-02-08 MED ORDER — MUPIROCIN 2 % EX OINT
1.0000 "application " | TOPICAL_OINTMENT | Freq: Two times a day (BID) | CUTANEOUS | Status: DC
  Administered 2022-02-09: 1 via NASAL
  Filled 2022-02-08: qty 22

## 2022-02-08 MED ORDER — HEMOSTATIC AGENTS (NO CHARGE) OPTIME
TOPICAL | Status: DC | PRN
  Administered 2022-02-08: 1

## 2022-02-08 MED ORDER — DEXAMETHASONE SODIUM PHOSPHATE 10 MG/ML IJ SOLN
INTRAMUSCULAR | Status: DC | PRN
  Administered 2022-02-08: 10 mg via INTRAVENOUS

## 2022-02-08 MED ORDER — CHLORHEXIDINE GLUCONATE 0.12 % MT SOLN
15.0000 mL | Freq: Once | OROMUCOSAL | Status: AC
  Administered 2022-02-08: 15 mL via OROMUCOSAL

## 2022-02-08 MED ORDER — ONDANSETRON HCL 4 MG/2ML IJ SOLN
INTRAMUSCULAR | Status: DC | PRN
  Administered 2022-02-08: 4 mg via INTRAVENOUS

## 2022-02-08 MED ORDER — MIDAZOLAM HCL 2 MG/2ML IJ SOLN: 2.0000 mg | Freq: Once | INTRAMUSCULAR | Status: DC

## 2022-02-08 MED ORDER — LACTATED RINGERS IV SOLN: INTRAVENOUS | Status: DC

## 2022-02-08 MED ORDER — FENTANYL CITRATE (PF) 250 MCG/5ML IJ SOLN
INTRAMUSCULAR | Status: AC
  Filled 2022-02-08: qty 5

## 2022-02-08 MED ORDER — ROCURONIUM BROMIDE 10 MG/ML (PF) SYRINGE
PREFILLED_SYRINGE | INTRAVENOUS | Status: DC | PRN
  Administered 2022-02-08: 60 mg via INTRAVENOUS

## 2022-02-08 MED ORDER — FENTANYL CITRATE (PF) 100 MCG/2ML IJ SOLN
INTRAMUSCULAR | Status: AC
Start: 2022-02-08 — End: ?
  Filled 2022-02-08: qty 2

## 2022-02-08 MED ORDER — BUPIVACAINE LIPOSOME 1.3 % IJ SUSP
INTRAMUSCULAR | Status: AC
  Filled 2022-02-08: qty 20

## 2022-02-08 MED ORDER — DOCUSATE SODIUM 100 MG PO CAPS
100.0000 mg | ORAL_CAPSULE | Freq: Two times a day (BID) | ORAL | 2 refills | Status: DC
Start: 2022-02-08 — End: 2022-02-19

## 2022-02-08 MED ORDER — LIDOCAINE HCL (PF) 2 % IJ SOLN
INTRAMUSCULAR | Status: AC
  Filled 2022-02-08: qty 5

## 2022-02-08 MED ORDER — SODIUM CHLORIDE 0.9 % IR SOLN
Status: DC | PRN
  Administered 2022-02-08: 1000 mL

## 2022-02-08 MED ORDER — HYDROMORPHONE HCL 1 MG/ML IJ SOLN
0.2500 mg | INTRAMUSCULAR | Status: DC | PRN
  Administered 2022-02-08: 0.5 mg via INTRAVENOUS
  Filled 2022-02-08: qty 0.5

## 2022-02-08 MED ORDER — SCOPOLAMINE 1 MG/3DAYS TD PT72
MEDICATED_PATCH | TRANSDERMAL | Status: AC
  Filled 2022-02-08: qty 1

## 2022-02-08 MED ORDER — OXYCODONE HCL 5 MG PO TABS: 5.0000 mg | ORAL_TABLET | Freq: Four times a day (QID) | ORAL | 0 refills | Status: AC | PRN

## 2022-02-08 MED ORDER — ACETAMINOPHEN 500 MG PO TABS: 1000.0000 mg | ORAL_TABLET | Freq: Four times a day (QID) | ORAL | 2 refills | Status: AC

## 2022-02-08 MED ORDER — MIDAZOLAM HCL 5 MG/5ML IJ SOLN
INTRAMUSCULAR | Status: DC | PRN
  Administered 2022-02-08: 2 mg via INTRAVENOUS

## 2022-02-08 MED ORDER — SCOPOLAMINE 1 MG/3DAYS TD PT72
1.0000 | MEDICATED_PATCH | Freq: Once | TRANSDERMAL | Status: DC
  Administered 2022-02-08: 1.5 mg via TRANSDERMAL

## 2022-02-08 SURGICAL SUPPLY — 41 items
APPLIER CLIP ROT 10 11.4 M/L (STAPLE) ×2
BAG RETRIEVAL 10 (BASKET) ×1
BLADE SURG 15 STRL LF DISP TIS (BLADE) ×1 IMPLANT
BLADE SURG 15 STRL SS (BLADE) ×1
CHLORAPREP W/TINT 26 (MISCELLANEOUS) ×2 IMPLANT
CLIP APPLIE ROT 10 11.4 M/L (STAPLE) ×1 IMPLANT
CLOTH BEACON ORANGE TIMEOUT ST (SAFETY) ×2 IMPLANT
COVER LIGHT HANDLE STERIS (MISCELLANEOUS) ×4 IMPLANT
DERMABOND ADVANCED (GAUZE/BANDAGES/DRESSINGS) ×1
DERMABOND ADVANCED .7 DNX12 (GAUZE/BANDAGES/DRESSINGS) ×1 IMPLANT
ELECT REM PT RETURN 9FT ADLT (ELECTROSURGICAL) ×2
ELECTRODE REM PT RTRN 9FT ADLT (ELECTROSURGICAL) ×1 IMPLANT
GAUZE 4X4 16PLY ~~LOC~~+RFID DBL (SPONGE) ×1 IMPLANT
GLOVE SURG ENC MOIS LTX SZ6.5 (GLOVE) ×2 IMPLANT
GLOVE SURG UNDER POLY LF SZ7 (GLOVE) ×8 IMPLANT
GOWN STRL REUS W/TWL LRG LVL3 (GOWN DISPOSABLE) ×6 IMPLANT
HEMOSTAT SNOW SURGICEL 2X4 (HEMOSTASIS) ×2 IMPLANT
INST SET LAPROSCOPIC AP (KITS) ×2 IMPLANT
KIT TURNOVER KIT A (KITS) ×2 IMPLANT
MANIFOLD NEPTUNE II (INSTRUMENTS) ×2 IMPLANT
NDL HYPO 25X1 1.5 SAFETY (NEEDLE) IMPLANT
NDL INSUFFLATION 14GA 120MM (NEEDLE) ×1 IMPLANT
NEEDLE HYPO 25X1 1.5 SAFETY (NEEDLE) ×2 IMPLANT
NEEDLE INSUFFLATION 14GA 120MM (NEEDLE) ×2 IMPLANT
NS IRRIG 1000ML POUR BTL (IV SOLUTION) ×2 IMPLANT
PACK LAP CHOLE LZT030E (CUSTOM PROCEDURE TRAY) ×2 IMPLANT
PAD ARMBOARD 7.5X6 YLW CONV (MISCELLANEOUS) ×2 IMPLANT
PENCIL HANDSWITCHING (ELECTRODE) ×1 IMPLANT
SET BASIN LINEN APH (SET/KITS/TRAYS/PACK) ×2 IMPLANT
SET TUBE SMOKE EVAC HIGH FLOW (TUBING) ×2 IMPLANT
SLEEVE ENDOPATH XCEL 5M (ENDOMECHANICALS) ×2 IMPLANT
SUT MNCRL AB 4-0 PS2 18 (SUTURE) ×4 IMPLANT
SUT VICRYL 0 UR6 27IN ABS (SUTURE) ×2 IMPLANT
SYR 20ML LL LF (SYRINGE) ×1 IMPLANT
SYS BAG RETRIEVAL 10MM (BASKET) ×1
SYSTEM BAG RETRIEVAL 10MM (BASKET) ×1 IMPLANT
TROCAR ENDO BLADELESS 11MM (ENDOMECHANICALS) ×2 IMPLANT
TROCAR XCEL NON-BLD 5MMX100MML (ENDOMECHANICALS) ×2 IMPLANT
TROCAR XCEL UNIV SLVE 11M 100M (ENDOMECHANICALS) ×2 IMPLANT
TUBE CONNECTING 12X1/4 (SUCTIONS) ×2 IMPLANT
WARMER LAPAROSCOPE (MISCELLANEOUS) ×2 IMPLANT

## 2022-02-08 NOTE — TOC Progression Note (Signed)
?  Transition of Care (TOC) Screening Note ? ? ?Patient Details  ?Name: Tammy Singh ?Date of Birth: 09-12-2001 ? ? ?Transition of Care (TOC) CM/SW Contact:    ?Elliot Gault, LCSW ?Phone Number: ?02/08/2022, 11:00 AM ? ? ? ?Transition of Care Department [facility]) has reviewed patient and no TOC needs have been identified at this time. We will continue to monitor patient advancement through interdisciplinary progression rounds. If new patient transition needs arise, please place a TOC consult. ? ? ?

## 2022-02-08 NOTE — Plan of Care (Signed)
?  Problem: Health Behavior/Discharge Planning: ?Goal: Ability to manage health-related needs will improve ?Outcome: Progressing ?  ?Problem: Nutrition: ?Goal: Adequate nutrition will be maintained ?Outcome: Progressing ?  ?Problem: Safety: ?Goal: Ability to remain free from injury will improve ?Outcome: Progressing ?  ?Problem: Pain Managment: ?Goal: General experience of comfort will improve ?Outcome: Progressing ?  ?Problem: Skin Integrity: ?Goal: Risk for impaired skin integrity will decrease ?Outcome: Progressing ?  ?

## 2022-02-08 NOTE — Progress Notes (Signed)
Patient given two copies of work note.  ?

## 2022-02-08 NOTE — Interval H&P Note (Signed)
History and Physical Interval Note: ? ?02/08/2022 ?10:00 AM ? ?Tammy Singh  has presented today for surgery, with the diagnosis of gallstones pancreatitis, cholecystitis.  The various methods of treatment have been discussed with the patient and family. After consideration of risks, benefits and other options for treatment, the patient has consented to  Procedure(s): ?LAPAROSCOPIC CHOLECYSTECTOMY (N/A) as a surgical intervention.  The patient's history has been reviewed, patient examined, no change in status, stable for surgery.  I have reviewed the patient's chart and labs.  Questions were answered to the patient's satisfaction.   ? ? ?Tammy Singh A [name] ? ? ?

## 2022-02-08 NOTE — Progress Notes (Signed)
[facility] ? ?To whom it may concern, ? ?Ms. Kaleia Reindl was in the hospital starting 02/07/2022 and had surgery on 02/08/2022. She can return to work without restrictions on 02/26/2022. ? ?If you have questions or concerns, call the office. ? ?Curlene Labrum, MD ?[facility] ?[place], [place] [address] ?[phone] (office) ? ?

## 2022-02-08 NOTE — Discharge Instructions (Signed)
Ambulatory Surgery Discharge Instructions  General Anesthesia or Sedation Do not drive or operate heavy machinery for 24 hours.  Do not consume alcohol, tranquilizers, sleeping medications, or any non-prescribed medications for 24 hours. Do not make important decisions or sign any important papers in the next 24 hours. You should have someone with you tonight at home.  Activity  You are advised to go directly home from the hospital.  Restrict your activities and rest for a day.  Resume light activity tomorrow. No heavy lifting over 10 lbs or strenuous exercise.  Fluids and Diet Begin with clear liquids, bouillon, dry toast, soda crackers.  If not nauseated, you may go to a regular diet when you desire.  Greasy and spicy foods are not advised.  Medications  If you have not had a bowel movement in 24 hours, take 2 tablespoons over the counter Milk of mag.             You May resume your blood thinners tomorrow (Aspirin, coumadin, or other).  You are being discharged with prescriptions for Opioid/Narcotic Medications: There are some specific considerations for these medications that you should know. Opioid Meds have risks & benefits. Addiction to these meds is always a concern with prolonged use Take medication only as directed Do not drive while taking narcotic pain medication Do not crush tablets or capsules Do not use a different container than medication was dispensed in Lock the container of medication in a cool, dry place out of reach of children and pets. Opioid medication can cause addiction Do not share with anyone else (this is a felony) Do not store medications for future use. Dispose of them properly.     Disposal:  Find a [facility] household drug take back site near you.  If you can't get to a drug take back site, use the recipe below as a last resort to dispose of expired, unused or unwanted drugs. Disposal  (Do not dispose chemotherapy drugs this way, talk to your  prescribing doctor instead.) Step 1: Mix drugs (do not crush) with dirt, kitty litter, or used coffee grounds and add a small amount of water to dissolve any solid medications. Step 2: Seal drugs in plastic bag. Step 3: Place plastic bag in trash. Step 4: Take prescription container and scratch out personal information, then recycle or throw away.  Operative Site  You have a liquid bandage over your incisions, this will begin to flake off in about a week. Ok to shower tomorrow. Keep wound clean and dry. No baths or swimming. No lifting more than 10 pounds.  Contact Information: If you have questions or concerns, please call our office, [phone], Monday- Thursday 8AM-5PM and Friday 8AM-12Noon.  If it is after hours or on the weekend, please call [facility], [phone], and ask to speak to the surgeon on call for Dr. Karan Inclan at [place].   SPECIFIC COMPLICATIONS TO WATCH FOR: Inability to urinate Fever over 101? F by mouth Nausea and vomiting lasting longer than 24 hours. Pain not relieved by medication ordered Swelling around the operative site Increased redness, warmth, hardness, around operative area Numbness, tingling, or cold fingers or toes Blood -soaked dressing, (small amounts of oozing may be normal) Increasing and progressive drainage from surgical area or exam site  

## 2022-02-08 NOTE — Progress Notes (Signed)
Update Note: ? ?Spoke with the patient in PACU.  Explained that her gallbladder was able to be removed without issue.  I was unable to get a hold of either grandparent (one phone with busy signal and other went to voicemail twice), and I could not find a number for her boyfriend.  All questions answered to her expressed satisfaction. ? ?-Regular diet ?-Pain control and antiemetics ?-Possible discharge home later today if pain adequately controlled with PO medications and able to tolerate a diet without nausea and vomiting ? ?Lamoyne Hessel, DO ?[facility] ?[address] ?[place], [place] [address] ?[phone] (office) ? ?

## 2022-02-08 NOTE — Anesthesia Postprocedure Evaluation (Signed)
Anesthesia Post Note ? ?Patient: Tammy Singh ? ?Procedure(s) Performed: LAPAROSCOPIC CHOLECYSTECTOMY (Abdomen) ? ?Patient location during evaluation: PACU ?Anesthesia Type: General ?Level of consciousness: awake and alert and oriented ?Pain management: pain level controlled ?Vital Signs Assessment: post-procedure vital signs reviewed and stable ?Respiratory status: spontaneous breathing, nonlabored ventilation and respiratory function stable ?Cardiovascular status: blood pressure returned to baseline and stable ?Postop Assessment: no apparent nausea or vomiting ?Anesthetic complications: no ? ? ?No notable events documented. ? ? ?Last Vitals:  ?Vitals:  ? 02/08/22 1145 02/08/22 1226  ?BP: (!) 112/50 112/65  ?Pulse: (!) 51 (!) 52  ?Resp: 15 14  ?Temp:    ?SpO2: 100% 100%  ?  ?Last Pain:  ?Vitals:  ? 02/08/22 1253  ?TempSrc:   ?PainSc: 7   ? ? ?  ?  ?  ?  ?  ?  ? ?Allianna Beaubien C Melessa Cowell ? ? ? ? ?

## 2022-02-08 NOTE — Op Note (Signed)
Operative Note ?  ?Preoperative Diagnosis: Pancreatitis, cholelithiasis, acute cholecystitis ?  ?Postoperative Diagnosis: Same ?  ?Procedure(s) Performed: Laparoscopic cholecystectomy ?  ?Surgeon: Theophilus Kinds, DO  ?  ?Assistants: Cecile Sheerer, RN ?  ?Anesthesia: General endotracheal ?  ?Anesthesiologist: Molli Barrows, MD  ?  ?Specimens: Gallbladder  ?  ?Estimated Blood Loss: Minimal  ?  ?Blood Replacement: None  ?  ?Complications: None  ?  ?Operative Findings: Patient with minimally inflamed gallbladder, distended ? ?Indications:  Patient is a 21 year old female who presents with epigastric abdominal pain.  She was found to have pancreatitis with an elevated lipase.  She underwent abdominal ultrasound which demonstrated sludge.  She further underwent and MRCP which did not demonstrate choledocholithiasis, but did demonstrate a mildly thickened gallbladder wall with sludge.  Decision was made to perform cholecystectomy. ? ?All risks, benefits, and alternatives to Laparoscopic cholecystectomy were discussed with the patient, all of her questions were answered to her expressed satisfaction. The patient expresses she wishes to proceed, and informed consent was obtained. ?  ?Procedure: The patient was taken to the operating room and placed supine. General endotracheal anesthesia was induced. Intravenous antibiotics were administered per protocol. An orogastric tube positioned to decompress the stomach. The abdomen was prepared and draped in the usual sterile fashion.  ?  ?A infraumbilical incision was made and a Veress technique was utilized to achieve pneumoperitoneum to 15 mmHg with carbon dioxide. A 11 mm optiview port was placed through the supraumbilical region, and a 10 mm 0-degree operative laparoscope was introduced. The area underlying the trocar and Veress needle were inspected and without evidence of injury.  Remaining trocars were placed under direct vision. Two 5 mm ports were placed in the  right abdomen, between the anterior axillary and midclavicular line.  A final 11 mm port was placed through the mid-epigastrium, near the falciform ligament.  ?  ?The gallbladder fundus was elevated cephalad and the infundibulum was retracted to the patient's right. The gallbladder/cystic duct junction was skeletonized. The cystic artery noted in the triangle of Calot and was also skeletonized.  We then continued liberal medial and lateral dissection until the critical view of safety was achieved.  ?  ?The cystic duct and cystic artery were doubly clipped and divided. The gallbladder was then dissected from the liver bed with electrocautery. The specimen was placed in an Endopouch and was retrieved through the epigastric site. ?  ?Final inspection revealed acceptable hemostasis. Surgical SNOW was placed in the gallbladder bed.  Trocars were removed and pneumoperitoneum was released.  0 Vicryl fascial sutures were used to close the epigastric and umbilical port sites. Skin incisions were closed with 4-0 Monocryl subcuticular sutures and Dermabond. The patient was awakened from anesthesia and extubated without complication.  ?  ?Theophilus Kinds, DO  ?[facility] ?[address] ?[place], [place] [address] ?[phone] (office) ?  ?

## 2022-02-08 NOTE — Anesthesia Preprocedure Evaluation (Signed)
Anesthesia Evaluation  ?Patient identified by MRN, date of birth, ID band ?Patient awake ? ? ? ?Reviewed: ?Allergy & Precautions, NPO status , Patient's Chart, lab work & pertinent test results ? ?Airway ?Mallampati: I ? ?TM Distance: >3 FB ?Neck ROM: Full ? ? ? Dental ? ?(+) Dental Advisory Given ?  ?Pulmonary ?Current Smoker (VAPING),  ?  ?Pulmonary exam normal ?breath sounds clear to auscultation ? ? ? ? ? ? Cardiovascular ?negative cardio ROS ?Normal cardiovascular exam ?Rhythm:Regular Rate:Normal ? ? ?  ?Neuro/Psych ?PSYCHIATRIC DISORDERS Depression negative neurological ROS ?   ? GI/Hepatic ?negative GI ROS, Neg liver ROS,   ?Endo/Other  ?negative endocrine ROS ? Renal/GU ?negative Renal ROS  ?negative genitourinary ?  ?Musculoskeletal ?negative musculoskeletal ROS ?(+)  ? Abdominal ?  ?Peds ?negative pediatric ROS ?(+)  Hematology ?negative hematology ROS ?(+)   ?Anesthesia Other Findings ? ? Reproductive/Obstetrics ?negative OB ROS ? ?  ? ? ? ? ? ? ? ? ? ? ? ? ? ?  ?  ? ? ? ? ? ? ? ? ?Anesthesia Physical ?Anesthesia Plan ? ?ASA: 2 ? ?Anesthesia Plan: General  ? ?Post-op Pain Management: Dilaudid IV  ? ?Induction: Intravenous ? ?PONV Risk Score and Plan: 4 or greater and Ondansetron, Dexamethasone and Midazolam ? ?Airway Management Planned: Oral ETT ? ?Additional Equipment:  ? ?Intra-op Plan:  ? ?Post-operative Plan: Extubation in OR ? ?Informed Consent: I have reviewed the patients History and Physical, chart, labs and discussed the procedure including the risks, benefits and alternatives for the proposed anesthesia with the patient or authorized representative who has indicated his/her understanding and acceptance.  ? ? ? ?Dental advisory given ? ?Plan Discussed with: CRNA and Surgeon ? ?Anesthesia Plan Comments:   ? ? ? ? ? ? ?Anesthesia Quick Evaluation ? ?

## 2022-02-08 NOTE — Anesthesia Procedure Notes (Signed)
Procedure Name: Intubation ?Date/Time: 02/08/2022 10:12 AM ?Performed by: Myna Bright, CRNA ?Pre-anesthesia Checklist: Patient identified, Emergency Drugs available, Suction available and Patient being monitored ?Patient Re-evaluated:Patient Re-evaluated prior to induction ?Oxygen Delivery Method: Circle system utilized ?Preoxygenation: Pre-oxygenation with 100% oxygen ?Induction Type: IV induction ?Ventilation: Mask ventilation without difficulty ?Laryngoscope Size: Mac and 3 ?Grade View: Grade I ?Tube type: Oral ?Tube size: 7.0 mm ?Number of attempts: 1 ?Airway Equipment and Method: Stylet ?Placement Confirmation: ETT inserted through vocal cords under direct vision, positive ETCO2 and breath sounds checked- equal and bilateral ?Secured at: 21 cm ?Tube secured with: Tape ?Dental Injury: Teeth and Oropharynx as per pre-operative assessment  ? ? ? ? ?

## 2022-02-08 NOTE — Transfer of Care (Signed)
Immediate Anesthesia Transfer of Care Note ? ?Patient: Tammy Singh ? ?Procedure(s) Performed: LAPAROSCOPIC CHOLECYSTECTOMY (Abdomen) ? ?Patient Location: PACU ? ?Anesthesia Type:General ? ?Level of Consciousness: awake, alert , oriented and patient cooperative ? ?Airway & Oxygen Therapy: Patient Spontanous Breathing and Patient connected to nasal cannula oxygen ? ?Post-op Assessment: Report given to RN, Post -op Vital signs reviewed and stable and Patient moving all extremities ? ?Post vital signs: Reviewed and stable ? ?Last Vitals:  ?Vitals Value Taken Time  ?BP    ?Temp    ?Pulse 87 02/08/22 1133  ?Resp 11 02/08/22 1133  ?SpO2 93 % 02/08/22 1133  ?Vitals shown include unvalidated device data. ? ?Last Pain:  ?Vitals:  ? 02/08/22 0840  ?TempSrc:   ?PainSc: 0-No pain  ?   ? ?  ? ?Complications: No notable events documented. ?

## 2022-02-08 NOTE — Plan of Care (Signed)
  Problem: Health Behavior/Discharge Planning: Goal: Ability to manage health-related needs will improve Outcome: Progressing   Problem: Clinical Measurements: Goal: Ability to maintain clinical measurements within normal limits will improve Outcome: Progressing   

## 2022-02-08 NOTE — Progress Notes (Signed)
[facility] ? ?Patient still with pain, staying overnight. ? ?Curlene Labrum, MD ?[facility] ?[place], [place] [address] ?[phone] (office) ? ?

## 2022-02-09 LAB — COMPREHENSIVE METABOLIC PANEL
ALT: 114 U/L — ABNORMAL HIGH (ref 0–44)
AST: 88 U/L — ABNORMAL HIGH (ref 15–41)
Albumin: 3.4 g/dL — ABNORMAL LOW (ref 3.5–5.0)
Alkaline Phosphatase: 91 U/L (ref 38–126)
Anion gap: 8 (ref 5–15)
BUN: 7 mg/dL (ref 6–20)
CO2: 23 mmol/L (ref 22–32)
Calcium: 9.1 mg/dL (ref 8.9–10.3)
Chloride: 107 mmol/L (ref 98–111)
Creatinine, Ser: 0.66 mg/dL (ref 0.44–1.00)
GFR, Estimated: >60 mL/min (ref >60)
Glucose, Bld: 132 mg/dL — ABNORMAL HIGH (ref 70–99)
Potassium: 3.8 mmol/L (ref 3.5–5.1)
Sodium: 138 mmol/L (ref 135–145)
Total Bilirubin: 0.7 mg/dL (ref 0.3–1.2)
Total Protein: 6.3 g/dL — ABNORMAL LOW (ref 6.5–8.1)

## 2022-02-09 LAB — CBC
HCT: 36.9 % (ref 36.0–46.0)
Hemoglobin: 11.4 g/dL — ABNORMAL LOW (ref 12.0–15.0)
MCH: 26.3 pg (ref 26.0–34.0)
MCHC: 30.9 g/dL (ref 30.0–36.0)
MCV: 85.2 fL (ref 80.0–100.0)
Platelets: 370 10*3/uL (ref 150–400)
RBC: 4.33 MIL/uL (ref 3.87–5.11)
RDW: 14 % (ref 11.5–15.5)
WBC: 11.3 10*3/uL — ABNORMAL HIGH (ref 4.0–10.5)
nRBC: 0 % (ref 0.0–0.2)

## 2022-02-09 LAB — SURGICAL PATHOLOGY

## 2022-02-09 LAB — LIPASE, BLOOD: Lipase: 26 U/L (ref 11–51)

## 2022-02-09 NOTE — Discharge Summary (Signed)
Physician Discharge Summary  ?Patient ID: ?Tammy Singh ?MRN: [id number] ?DOB/AGE: 2001/01/25 21 y.o. ? ?Admit date: 02/06/2022 ?Discharge date: 02/09/2022 ? ?Admission Diagnoses: Pancreatitis  ? ?Discharge Diagnoses:  ?Principal Problem: ?  Pancreatitis ?Active Problems: ?  Leukocytosis ?  Gallstones ?  Cholecystitis, acute ? ? ?Discharged Condition: good ? ?Hospital Course: Ms. Polhemus was admitted with pancreatitis and found to have gallstones/ sludge. She was scheduled for surgery and underwent a laparoscopic cholecystectomy. She had some pain after surgery and stayed overnight. This Am she was eating and having adequate pain control.  ? ?Consults:  hospitalist admission-  taken over by surgery ? ?Significant Diagnostic Studies: MRCP- cholecystitis and sludge  ? ?Treatments: surgery: laparoscopic cholecystectomy 02/08/2022 ? ?Discharge Exam: ?Blood pressure 116/68, pulse (!) 47, temperature 98 ?F (36.7 ?C), temperature source Oral, resp. rate 15, height 5\' 6"  (1.676 m), weight 72.6 kg, SpO2 99 %. ?General appearance: alert and no distress ?GI: soft, nondistended, appropriately tender, port sites c//di with dermabond  ? ?Disposition: Discharge disposition: 01-Home or Self Care ? ? ? ? ? ? ?Discharge Instructions   ? ? Call MD for:  difficulty breathing, headache or visual disturbances   Complete by: As directed ?  ? Call MD for:  difficulty breathing, headache or visual disturbances   Complete by: As directed ?  ? Call MD for:  extreme fatigue   Complete by: As directed ?  ? Call MD for:  hives   Complete by: As directed ?  ? Call MD for:  persistant dizziness or light-headedness   Complete by: As directed ?  ? Call MD for:  persistant dizziness or light-headedness   Complete by: As directed ?  ? Call MD for:  persistant nausea and vomiting   Complete by: As directed ?  ? Call MD for:  persistant nausea and vomiting   Complete by: As directed ?  ? Call MD for:  redness, tenderness, or signs of infection (pain,  swelling, redness, odor or green/yellow discharge around incision site)   Complete by: As directed ?  ? Call MD for:  redness, tenderness, or signs of infection (pain, swelling, redness, odor or green/yellow discharge around incision site)   Complete by: As directed ?  ? Call MD for:  severe uncontrolled pain   Complete by: As directed ?  ? Call MD for:  severe uncontrolled pain   Complete by: As directed ?  ? Call MD for:  temperature >100.4   Complete by: As directed ?  ? Call MD for:  temperature >100.4   Complete by: As directed ?  ? Diet - low sodium heart healthy   Complete by: As directed ?  ? Increase activity slowly   Complete by: As directed ?  ? Increase activity slowly   Complete by: As directed ?  ? ?  ? ?Allergies as of 02/09/2022   ?No Known Allergies ?  ? ?  ?Medication List  ?  ? ?TAKE these medications   ? ?acetaminophen 500 MG tablet ?Commonly known as: TYLENOL ?Take 2 tablets (1,000 mg total) by mouth every 6 (six) hours for 7 days. ?  ?calcium carbonate 500 MG chewable tablet ?Commonly known as: TUMS - dosed in mg elemental calcium ?Chew 1 tablet by mouth daily as needed for indigestion or heartburn. ?  ?docusate sodium 100 MG capsule ?Commonly known as: Colace ?Take 1 capsule (100 mg total) by mouth 2 (two) times daily. ?  ?escitalopram 20 MG tablet ?Commonly known as: LEXAPRO ?  Take 1.5 tablets (30 mg total) by mouth daily. ?  ?oxyCODONE 5 MG immediate release tablet ?Commonly known as: Roxicodone ?Take 1 tablet (5 mg total) by mouth every 6 (six) hours as needed for up to 7 days. ?  ? ?  ? ? Follow-up Information   ? ? Pappayliou, Gustavus Messing, DO. Call.   ?Specialty: General Surgery ?Why: Call to follow up with me in 2 weeks ?Contact information: ?[address] ?[place] [address] ?[phone] ? ? ?  ?  ? ?  ?  ? ?  ? ?Future Appointments  ?Date Time Provider Department Center  ?02/19/2022  1:30 PM Pappayliou, Santina Evans A, DO [facility] None  ? ? ? ?Signed: ?Lucretia Roers ?02/09/2022, 1:49  PM ? ? ?

## 2022-02-09 NOTE — Progress Notes (Signed)
Patient has been stable and has only required minimal amounts of pain medication during shift.  Patient has tolerated po fluids and food.  IVF not connected during shift.  Patient has voided and able to care for self.  Vitals stable. ?

## 2022-02-13 ENCOUNTER — Encounter (HOSPITAL_COMMUNITY): Payer: Self-pay | Admitting: Surgery

## 2022-02-19 ENCOUNTER — Encounter: Payer: Self-pay | Admitting: Surgery

## 2022-02-19 ENCOUNTER — Ambulatory Visit (INDEPENDENT_AMBULATORY_CARE_PROVIDER_SITE_OTHER): Payer: Medicaid Other | Admitting: Surgery

## 2022-02-19 VITALS — BP 124/80 | HR 80 | Temp 97.7°F | Resp 16

## 2022-02-19 DIAGNOSIS — Z09 Encounter for follow-up examination after completed treatment for conditions other than malignant neoplasm: Secondary | ICD-10-CM

## 2022-02-19 NOTE — Progress Notes (Signed)
[facility] Note  ? ?HPI:  ?21 y.o. Female presents to clinic for post-op follow-up s/p laparoscopic cholecystectomy on 4/6. Patient reports that she has been doing well since surgery. Her pain is significantly improved, and she only has some occasional soreness in the epigastrium.  She is tolerating a diet without nausea and vomiting.  She is moving her bowels without issue, though it has been a little looser since the surgery. She denies fevers and chills. ? ?Review of Systems:  ?All other review of systems: otherwise negative  ? ?Vital Signs:  ?There were no vitals taken for this visit.  ? ?Physical Exam:  ?Physical Exam ?Vitals reviewed.  ?Constitutional:   ?   Appearance: Normal appearance.  ?Abdominal:  ?   Comments: Abdomen soft, nondistended, no percussion tenderness, nontender to palpation, Incisions C/D/I with skin glue in place, healing well without erythema  ?Neurological:  ?   Mental Status: She is alert.  ? ? ?Laboratory studies: None  ? ?Imaging:  ?None ? ?Pathology: ?A. GALLBLADDER, CHOLECYSTECTOMY:  ?Chronic cholecystitis and cholelithiasis.  ?Cholesterolosis of the gallbladder.  ?Negative for neoplasm.   ? ?Assessment:  ?21 y.o. yo Female who presents s/p laparoscopic cholecystectomy on 4/6 for gallstone pancreatitis. ? ?Plan:  ?-Patient doing well postoperatively  ?-Advised her that she may have some loose bowel movements for a little while after her surgery.  This should improve on its own, and if it does not, we can prescribe her something to help with them ?-Once she is 2 weeks out from surgery, she can use antibiotic ointment to help dissolve away the skin glue ?-Follow up with me as needed ? ?All of the above recommendations were discussed with the patient, and all of patient's questions were answered to her expressed satisfaction. ? ?Graciella Freer, DO ?[facility] ?[place], [place] [address] ?[phone] (office) ? ?

## 2022-02-27 ENCOUNTER — Encounter: Payer: Medicaid Other | Admitting: Surgery
